# Patient Record
Sex: Female | Born: 1995 | Race: White | Hispanic: No | Marital: Single | Smoking: Never smoker
Health system: Southern US, Community
[De-identification: ages and names within clinical notes are randomized; demographics above are authoritative.]

## PROBLEM LIST (undated history)

## (undated) DIAGNOSIS — N739 Female pelvic inflammatory disease, unspecified: Secondary | ICD-10-CM

## (undated) DIAGNOSIS — E669 Obesity, unspecified: Secondary | ICD-10-CM

---
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
## 2016-11-19 DIAGNOSIS — N739 Female pelvic inflammatory disease, unspecified: Secondary | ICD-10-CM

## 2016-11-19 HISTORY — PX: APPENDECTOMY: SHX54

## 2016-11-19 HISTORY — DX: Female pelvic inflammatory disease, unspecified: N73.9

## 2016-12-04 ENCOUNTER — Emergency Department (HOSPITAL_COMMUNITY): Payer: Self-pay | Admitting: Anesthesiology

## 2016-12-04 ENCOUNTER — Emergency Department (HOSPITAL_COMMUNITY): Payer: Self-pay

## 2016-12-04 ENCOUNTER — Inpatient Hospital Stay (HOSPITAL_COMMUNITY)
Admission: EM | Admit: 2016-12-04 | Discharge: 2016-12-06 | DRG: 750 | Disposition: A | Discharge status: Home / Self Care | Payer: Self-pay | Attending: General Surgery | Admitting: General Surgery

## 2016-12-04 ENCOUNTER — Encounter (HOSPITAL_COMMUNITY): Admission: EM | Disposition: A | Discharge status: Home / Self Care | Payer: Self-pay | Source: Home / Self Care

## 2016-12-04 ENCOUNTER — Encounter (HOSPITAL_COMMUNITY): Payer: Self-pay | Admitting: Emergency Medicine

## 2016-12-04 DIAGNOSIS — Z793 Long term (current) use of hormonal contraceptives: Secondary | ICD-10-CM

## 2016-12-04 DIAGNOSIS — D72828 Other elevated white blood cell count: Secondary | ICD-10-CM

## 2016-12-04 DIAGNOSIS — R198 Other specified symptoms and signs involving the digestive system and abdomen: Secondary | ICD-10-CM

## 2016-12-04 DIAGNOSIS — N730 Acute parametritis and pelvic cellulitis: Secondary | ICD-10-CM | POA: Diagnosis present

## 2016-12-04 DIAGNOSIS — Z68.41 Body mass index (BMI) pediatric, greater than or equal to 95th percentile for age: Secondary | ICD-10-CM

## 2016-12-04 DIAGNOSIS — N739 Female pelvic inflammatory disease, unspecified: Principal | ICD-10-CM | POA: Diagnosis present

## 2016-12-04 DIAGNOSIS — N83209 Unspecified ovarian cyst, unspecified side: Secondary | ICD-10-CM

## 2016-12-04 DIAGNOSIS — N938 Other specified abnormal uterine and vaginal bleeding: Secondary | ICD-10-CM | POA: Diagnosis present

## 2016-12-04 DIAGNOSIS — R109 Unspecified abdominal pain: Secondary | ICD-10-CM

## 2016-12-04 DIAGNOSIS — N83201 Unspecified ovarian cyst, right side: Secondary | ICD-10-CM | POA: Diagnosis present

## 2016-12-04 DIAGNOSIS — K668 Other specified disorders of peritoneum: Secondary | ICD-10-CM | POA: Diagnosis present

## 2016-12-04 HISTORY — DX: Female pelvic inflammatory disease, unspecified: N73.9

## 2016-12-04 HISTORY — PX: LAPAROSCOPIC APPENDECTOMY: SHX408

## 2016-12-04 HISTORY — PX: LAPAROSCOPY: SHX197

## 2016-12-04 LAB — URINALYSIS, ROUTINE W REFLEX MICROSCOPIC
BILIRUBIN URINE: NEGATIVE
GLUCOSE, UA: NEGATIVE mg/dL
Ketones, ur: NEGATIVE mg/dL
NITRITE: NEGATIVE
Protein, ur: 100 mg/dL — AB
SPECIFIC GRAVITY, URINE: 1.017 (ref 1.005–1.030)
pH: 6 (ref 5.0–8.0)

## 2016-12-04 LAB — COMPREHENSIVE METABOLIC PANEL
ALK PHOS: 85 U/L (ref 38–126)
ALT: 18 U/L (ref 14–54)
AST: 24 U/L (ref 15–41)
Albumin: 3.6 g/dL (ref 3.5–5.0)
Anion gap: 7 (ref 5–15)
BILIRUBIN TOTAL: 0.6 mg/dL (ref 0.3–1.2)
BUN: 10 mg/dL (ref 6–20)
CALCIUM: 9.1 mg/dL (ref 8.9–10.3)
CO2: 23 mmol/L (ref 22–32)
Chloride: 105 mmol/L (ref 101–111)
Creatinine, Ser: 0.55 mg/dL (ref 0.44–1.00)
GFR calc Af Amer: >60 mL/min (ref >60)
GLUCOSE: 110 mg/dL — AB (ref 65–99)
Potassium: 3.6 mmol/L (ref 3.5–5.1)
Sodium: 135 mmol/L (ref 135–145)
TOTAL PROTEIN: 6.8 g/dL (ref 6.5–8.1)

## 2016-12-04 LAB — I-STAT BETA HCG BLOOD, ED (MC, WL, AP ONLY): I-stat hCG, quantitative: <5 m[IU]/mL (ref <5)

## 2016-12-04 LAB — CBC
HCT: 31.2 % — ABNORMAL LOW (ref 36.0–46.0)
Hemoglobin: 9.8 g/dL — ABNORMAL LOW (ref 12.0–15.0)
MCH: 22.2 pg — ABNORMAL LOW (ref 26.0–34.0)
MCHC: 31.4 g/dL (ref 30.0–36.0)
MCV: 70.6 fL — AB (ref 78.0–100.0)
PLATELETS: 290 10*3/uL (ref 150–400)
RBC: 4.42 MIL/uL (ref 3.87–5.11)
RDW: 16.8 % — AB (ref 11.5–15.5)
WBC: 12.1 10*3/uL — AB (ref 4.0–10.5)

## 2016-12-04 LAB — TYPE AND SCREEN
ABO/RH(D): O POS
ANTIBODY SCREEN: NEGATIVE

## 2016-12-04 LAB — ABO/RH: ABO/RH(D): O POS

## 2016-12-04 LAB — WET PREP, GENITAL
Sperm: NONE SEEN
Trich, Wet Prep: NONE SEEN
Yeast Wet Prep HPF POC: NONE SEEN

## 2016-12-04 LAB — LIPASE, BLOOD: Lipase: 25 U/L (ref 11–51)

## 2016-12-04 SURGERY — LAPAROSCOPY, DIAGNOSTIC
Anesthesia: General | Site: Abdomen

## 2016-12-04 MED ORDER — PANTOPRAZOLE SODIUM 40 MG IV SOLR: 40.0000 mg | Freq: Every day | INTRAVENOUS | Status: DC

## 2016-12-04 MED ORDER — PROPOFOL 10 MG/ML IV BOLUS
INTRAVENOUS | Status: AC
  Filled 2016-12-04: qty 20

## 2016-12-04 MED ORDER — PHENYLEPHRINE 40 MCG/ML (10ML) SYRINGE FOR IV PUSH (FOR BLOOD PRESSURE SUPPORT)
PREFILLED_SYRINGE | INTRAVENOUS | Status: DC | PRN
  Administered 2016-12-04 (×6): 80 ug via INTRAVENOUS

## 2016-12-04 MED ORDER — LIDOCAINE 2% (20 MG/ML) 5 ML SYRINGE
INTRAMUSCULAR | Status: AC
  Filled 2016-12-04: qty 5

## 2016-12-04 MED ORDER — LACTATED RINGERS IV SOLN: INTRAVENOUS | Status: DC | PRN

## 2016-12-04 MED ORDER — MORPHINE SULFATE (PF) 2 MG/ML IV SOLN: 2.0000 mg | INTRAVENOUS | Status: DC | PRN

## 2016-12-04 MED ORDER — ROCURONIUM BROMIDE 10 MG/ML (PF) SYRINGE
PREFILLED_SYRINGE | INTRAVENOUS | Status: DC | PRN
  Administered 2016-12-04: 10 mg via INTRAVENOUS
  Administered 2016-12-04: 30 mg via INTRAVENOUS

## 2016-12-04 MED ORDER — ONDANSETRON HCL 4 MG/2ML IJ SOLN: 4.0000 mg | Freq: Four times a day (QID) | INTRAMUSCULAR | Status: DC | PRN

## 2016-12-04 MED ORDER — ONDANSETRON 4 MG PO TBDP: 4.0000 mg | ORAL_TABLET | Freq: Four times a day (QID) | ORAL | Status: DC | PRN

## 2016-12-04 MED ORDER — PANTOPRAZOLE SODIUM 40 MG IV SOLR
40.0000 mg | INTRAVENOUS | Status: DC
  Administered 2016-12-04 – 2016-12-05 (×2): 40 mg via INTRAVENOUS
  Filled 2016-12-04 (×2): qty 40

## 2016-12-04 MED ORDER — SODIUM CHLORIDE 0.9 % IV BOLUS (SEPSIS)
1000.0000 mL | Freq: Once | INTRAVENOUS | Status: AC
  Administered 2016-12-04: 1000 mL via INTRAVENOUS

## 2016-12-04 MED ORDER — SODIUM CHLORIDE 0.9 % IR SOLN
Status: DC | PRN
  Administered 2016-12-04 (×2): 1000 mL

## 2016-12-04 MED ORDER — ENOXAPARIN SODIUM 40 MG/0.4ML ~~LOC~~ SOLN
40.0000 mg | SUBCUTANEOUS | Status: DC
  Administered 2016-12-05 – 2016-12-06 (×2): 40 mg via SUBCUTANEOUS
  Filled 2016-12-04 (×2): qty 0.4

## 2016-12-04 MED ORDER — ONDANSETRON HCL 4 MG/2ML IJ SOLN
INTRAMUSCULAR | Status: AC
  Filled 2016-12-04: qty 2

## 2016-12-04 MED ORDER — ONDANSETRON HCL 4 MG/2ML IJ SOLN
INTRAMUSCULAR | Status: DC | PRN
  Administered 2016-12-04: 4 mg via INTRAVENOUS

## 2016-12-04 MED ORDER — PIPERACILLIN-TAZOBACTAM 3.375 G IVPB
3.3750 g | Freq: Three times a day (TID) | INTRAVENOUS | Status: DC
  Filled 2016-12-04 (×2): qty 50

## 2016-12-04 MED ORDER — DEXAMETHASONE SODIUM PHOSPHATE 10 MG/ML IJ SOLN
INTRAMUSCULAR | Status: DC | PRN
  Administered 2016-12-04: 10 mg via INTRAVENOUS

## 2016-12-04 MED ORDER — SODIUM CHLORIDE 0.9 % IV SOLN
INTRAVENOUS | Status: DC
  Administered 2016-12-04 – 2016-12-05 (×4): via INTRAVENOUS

## 2016-12-04 MED ORDER — LACTATED RINGERS IV SOLN
INTRAVENOUS | Status: DC | PRN
  Administered 2016-12-04: 14:00:00 via INTRAVENOUS

## 2016-12-04 MED ORDER — OXYCODONE HCL 5 MG PO TABS
5.0000 mg | ORAL_TABLET | ORAL | Status: DC | PRN
  Administered 2016-12-04: 5 mg via ORAL
  Administered 2016-12-05: 10 mg via ORAL
  Administered 2016-12-05: 5 mg via ORAL
  Filled 2016-12-04: qty 1
  Filled 2016-12-04 (×2): qty 2

## 2016-12-04 MED ORDER — BUPIVACAINE HCL (PF) 0.25 % IJ SOLN
INTRAMUSCULAR | Status: DC | PRN
  Administered 2016-12-04: 25 mL

## 2016-12-04 MED ORDER — MORPHINE SULFATE (PF) 4 MG/ML IV SOLN
4.0000 mg | Freq: Once | INTRAVENOUS | Status: AC
  Administered 2016-12-04: 4 mg via INTRAVENOUS
  Filled 2016-12-04: qty 1

## 2016-12-04 MED ORDER — BUPIVACAINE HCL (PF) 0.25 % IJ SOLN
INTRAMUSCULAR | Status: AC
  Filled 2016-12-04: qty 30

## 2016-12-04 MED ORDER — MIDAZOLAM HCL 2 MG/2ML IJ SOLN
INTRAMUSCULAR | Status: AC
  Filled 2016-12-04: qty 2

## 2016-12-04 MED ORDER — ALBUMIN HUMAN 5 % IV SOLN
INTRAVENOUS | Status: DC | PRN
  Administered 2016-12-04: 14:00:00 via INTRAVENOUS

## 2016-12-04 MED ORDER — DEXTROSE 5 % IV SOLN
100.0000 mg | Freq: Two times a day (BID) | INTRAVENOUS | Status: DC
  Administered 2016-12-04 – 2016-12-06 (×4): 100 mg via INTRAVENOUS
  Filled 2016-12-04 (×5): qty 100

## 2016-12-04 MED ORDER — DEXTROSE 5 % IV SOLN
2.0000 g | Freq: Four times a day (QID) | INTRAVENOUS | Status: DC
  Administered 2016-12-04 – 2016-12-06 (×7): 2 g via INTRAVENOUS
  Filled 2016-12-04 (×11): qty 2

## 2016-12-04 MED ORDER — FENTANYL CITRATE (PF) 100 MCG/2ML IJ SOLN
INTRAMUSCULAR | Status: DC | PRN
  Administered 2016-12-04: 100 ug via INTRAVENOUS
  Administered 2016-12-04 (×3): 50 ug via INTRAVENOUS

## 2016-12-04 MED ORDER — 0.9 % SODIUM CHLORIDE (POUR BTL) OPTIME
TOPICAL | Status: DC | PRN
  Administered 2016-12-04: 1000 mL

## 2016-12-04 MED ORDER — ACETAMINOPHEN 650 MG RE SUPP: 650.0000 mg | Freq: Four times a day (QID) | RECTAL | Status: DC | PRN

## 2016-12-04 MED ORDER — DEXAMETHASONE SODIUM PHOSPHATE 10 MG/ML IJ SOLN
INTRAMUSCULAR | Status: AC
  Filled 2016-12-04: qty 1

## 2016-12-04 MED ORDER — IOPAMIDOL (ISOVUE-300) INJECTION 61%
INTRAVENOUS | Status: AC
  Administered 2016-12-04: 100 mL
  Filled 2016-12-04: qty 100

## 2016-12-04 MED ORDER — DIPHENHYDRAMINE HCL 50 MG/ML IJ SOLN: 25.0000 mg | Freq: Four times a day (QID) | INTRAMUSCULAR | Status: DC | PRN

## 2016-12-04 MED ORDER — ACETAMINOPHEN 325 MG PO TABS
650.0000 mg | ORAL_TABLET | Freq: Four times a day (QID) | ORAL | Status: DC | PRN
  Administered 2016-12-05: 650 mg via ORAL
  Filled 2016-12-04: qty 2

## 2016-12-04 MED ORDER — FENTANYL CITRATE (PF) 250 MCG/5ML IJ SOLN
INTRAMUSCULAR | Status: AC
Start: 2016-12-04 — End: 2016-12-04
  Filled 2016-12-04: qty 5

## 2016-12-04 MED ORDER — PIPERACILLIN-TAZOBACTAM 3.375 G IVPB 30 MIN
3.3750 g | Freq: Once | INTRAVENOUS | Status: AC
  Administered 2016-12-04: 3.375 g via INTRAVENOUS
  Filled 2016-12-04: qty 50

## 2016-12-04 MED ORDER — ROCURONIUM BROMIDE 50 MG/5ML IV SOSY
PREFILLED_SYRINGE | INTRAVENOUS | Status: AC
  Filled 2016-12-04: qty 5

## 2016-12-04 MED ORDER — SUGAMMADEX SODIUM 200 MG/2ML IV SOLN
INTRAVENOUS | Status: DC | PRN
  Administered 2016-12-04: 200 mg via INTRAVENOUS

## 2016-12-04 MED ORDER — MIDAZOLAM HCL 2 MG/2ML IJ SOLN
INTRAMUSCULAR | Status: DC | PRN
  Administered 2016-12-04: 2 mg via INTRAVENOUS

## 2016-12-04 MED ORDER — DIPHENHYDRAMINE HCL 25 MG PO CAPS
25.0000 mg | ORAL_CAPSULE | Freq: Four times a day (QID) | ORAL | Status: DC | PRN
  Administered 2016-12-06: 25 mg via ORAL
  Filled 2016-12-04: qty 1

## 2016-12-04 MED ORDER — ONDANSETRON HCL 4 MG/2ML IJ SOLN
4.0000 mg | Freq: Once | INTRAMUSCULAR | Status: AC
  Administered 2016-12-04: 4 mg via INTRAVENOUS
  Filled 2016-12-04: qty 2

## 2016-12-04 MED ORDER — IOPAMIDOL (ISOVUE-370) INJECTION 76%
INTRAVENOUS | Status: AC
  Administered 2016-12-04: 100 mL
  Filled 2016-12-04: qty 100

## 2016-12-04 MED ORDER — LIDOCAINE 2% (20 MG/ML) 5 ML SYRINGE
INTRAMUSCULAR | Status: DC | PRN
  Administered 2016-12-04: 100 mg via INTRAVENOUS

## 2016-12-04 MED ORDER — PROPOFOL 10 MG/ML IV BOLUS
INTRAVENOUS | Status: DC | PRN
  Administered 2016-12-04: 200 mg via INTRAVENOUS

## 2016-12-04 MED ORDER — SUCCINYLCHOLINE CHLORIDE 200 MG/10ML IV SOSY
PREFILLED_SYRINGE | INTRAVENOUS | Status: DC | PRN
  Administered 2016-12-04: 120 mg via INTRAVENOUS

## 2016-12-04 SURGICAL SUPPLY — 50 items
CANISTER SUCT 3000ML PPV (MISCELLANEOUS) ×5 IMPLANT
CHLORAPREP W/TINT 26ML (MISCELLANEOUS) ×5 IMPLANT
COVER SURGICAL LIGHT HANDLE (MISCELLANEOUS) ×10 IMPLANT
CUTTER FLEX LINEAR 45M (STAPLE) ×5 IMPLANT
DERMABOND ADVANCED (GAUZE/BANDAGES/DRESSINGS) ×2
DERMABOND ADVANCED .7 DNX12 (GAUZE/BANDAGES/DRESSINGS) ×3 IMPLANT
DRAPE LAPAROSCOPIC ABDOMINAL (DRAPES) ×5 IMPLANT
DRAPE WARM FLUID 44X44 (DRAPE) ×5 IMPLANT
DRSG OPSITE POSTOP 4X10 (GAUZE/BANDAGES/DRESSINGS) IMPLANT
DRSG OPSITE POSTOP 4X8 (GAUZE/BANDAGES/DRESSINGS) IMPLANT
ELECT BLADE 6.5 EXT (BLADE) ×5 IMPLANT
ELECT CAUTERY BLADE 6.4 (BLADE) ×5 IMPLANT
ELECT REM PT RETURN 9FT ADLT (ELECTROSURGICAL) ×5
ELECTRODE REM PT RTRN 9FT ADLT (ELECTROSURGICAL) ×3 IMPLANT
GLOVE BIO SURGEON STRL SZ7 (GLOVE) ×5 IMPLANT
GLOVE BIO SURGEON STRL SZ8 (GLOVE) ×5 IMPLANT
GLOVE BIOGEL PI IND STRL 8 (GLOVE) ×3 IMPLANT
GLOVE BIOGEL PI INDICATOR 8 (GLOVE) ×2
GOWN STRL REUS W/ TWL LRG LVL3 (GOWN DISPOSABLE) ×6 IMPLANT
GOWN STRL REUS W/ TWL XL LVL3 (GOWN DISPOSABLE) ×3 IMPLANT
GOWN STRL REUS W/TWL LRG LVL3 (GOWN DISPOSABLE) ×4
GOWN STRL REUS W/TWL XL LVL3 (GOWN DISPOSABLE) ×2
GRASPER SUT TROCAR 14GX15 (MISCELLANEOUS) ×5 IMPLANT
KIT BASIN OR (CUSTOM PROCEDURE TRAY) ×5 IMPLANT
KIT ROOM TURNOVER OR (KITS) ×5 IMPLANT
LIGASURE IMPACT 36 18CM CVD LR (INSTRUMENTS) IMPLANT
NEEDLE 22X1 1/2 (OR ONLY) (NEEDLE) ×5 IMPLANT
NS IRRIG 1000ML POUR BTL (IV SOLUTION) ×10 IMPLANT
PAD ARMBOARD 7.5X6 YLW CONV (MISCELLANEOUS) ×10 IMPLANT
POUCH SPECIMEN RETRIEVAL 10MM (ENDOMECHANICALS) ×5 IMPLANT
RELOAD 45 VASCULAR/THIN (ENDOMECHANICALS) ×5 IMPLANT
SET IRRIG TUBING LAPAROSCOPIC (IRRIGATION / IRRIGATOR) ×5 IMPLANT
SHEARS HARMONIC ACE PLUS 36CM (ENDOMECHANICALS) ×5 IMPLANT
SLEEVE ENDOPATH XCEL 5M (ENDOMECHANICALS) ×10 IMPLANT
SPECIMEN JAR LARGE (MISCELLANEOUS) IMPLANT
SPONGE LAP 18X18 X RAY DECT (DISPOSABLE) IMPLANT
SUCTION POOLE TIP (SUCTIONS) ×5 IMPLANT
SUT SILK 2 0 SH CR/8 (SUTURE) ×5 IMPLANT
SUT SILK 2 0 TIES 10X30 (SUTURE) ×5 IMPLANT
SUT SILK 3 0 SH CR/8 (SUTURE) ×5 IMPLANT
SUT SILK 3 0 TIES 10X30 (SUTURE) ×5 IMPLANT
SUT VIC AB 4-0 PS2 18 (SUTURE) ×5 IMPLANT
SUT VICRYL 0 UR6 27IN ABS (SUTURE) ×5 IMPLANT
TOWEL OR 17X26 10 PK STRL BLUE (TOWEL DISPOSABLE) ×5 IMPLANT
TRAP SPECIMEN MUCOUS 40CC (MISCELLANEOUS) ×5 IMPLANT
TRAY FOLEY W/METER SILVER 16FR (SET/KITS/TRAYS/PACK) ×5 IMPLANT
TROCAR XCEL BLUNT TIP 100MML (ENDOMECHANICALS) ×5 IMPLANT
TROCAR XCEL NON-BLD 5MMX100MML (ENDOMECHANICALS) ×5 IMPLANT
TUBING INSUFFLATION (TUBING) ×5 IMPLANT
YANKAUER SUCT BULB TIP NO VENT (SUCTIONS) ×5 IMPLANT

## 2016-12-04 NOTE — ED Notes (Signed)
Patient transported to Ultrasound 

## 2016-12-04 NOTE — ED Notes (Signed)
General Surgery at the bedside

## 2016-12-04 NOTE — Anesthesia Preprocedure Evaluation (Addendum)
Anesthesia Evaluation  Patient identified by MRN, date of birth, ID band Patient awake    Reviewed: Allergy & Precautions, NPO status , Patient's Chart, lab work & pertinent test results  History of Anesthesia Complications Negative for: history of anesthetic complications  Airway Mallampati: II  TM Distance: >3 FB Neck ROM: Full    Dental no notable dental hx. (+) Dental Advisory Given   Pulmonary neg pulmonary ROS,    Pulmonary exam normal        Cardiovascular negative cardio ROS Normal cardiovascular exam     Neuro/Psych negative neurological ROS  negative psych ROS   GI/Hepatic negative GI ROS, Neg liver ROS,   Endo/Other  Morbid obesity  Renal/GU negative Renal ROS     Musculoskeletal   Abdominal   Peds  Hematology   Anesthesia Other Findings   Reproductive/Obstetrics                            Anesthesia Physical Anesthesia Plan  ASA: II  Anesthesia Plan: General   Post-op Pain Management:    Induction: Intravenous, Rapid sequence and Cricoid pressure planned  Airway Management Planned: Oral ETT  Additional Equipment:   Intra-op Plan:   Post-operative Plan: Extubation in OR  Informed Consent: I have reviewed the patients History and Physical, chart, labs and discussed the procedure including the risks, benefits and alternatives for the proposed anesthesia with the patient or authorized representative who has indicated his/her understanding and acceptance.   Dental advisory given  Plan Discussed with: CRNA, Anesthesiologist and Surgeon  Anesthesia Plan Comments:         Anesthesia Quick Evaluation

## 2016-12-04 NOTE — ED Triage Notes (Signed)
Pt reports lower abd pain that started at 0100 this date. Pt also reports vaginal bleeding for 3 months.

## 2016-12-04 NOTE — Op Note (Addendum)
12/04/2016  3:24 PM  PATIENT:  Brianna West  21 y.o. female  PRE-OPERATIVE DIAGNOSIS:  Free Air in the Abdomen   POST-OPERATIVE DIAGNOSIS: severe pelvic inflammatory disease  PROCEDURE:  Procedure(s): LAPAROSCOPY DIAGNOSTIC DRAINAGE OF PELVIC ABSCESS APPENDECTOMY LAPAROSCOPIC  SURGEON:  Surgeon(s): Violeta Gelinas, MD  ASSISTANTS: none   ANESTHESIA:   local and general  EBL:  Total I/O In: 4050 [I.V.:1750; IV Piggyback:2300] Out: 360 [Urine:300; Other:50; Blood:10]  BLOOD ADMINISTERED:none  DRAINS: none   SPECIMEN:  Excision  DISPOSITION OF SPECIMEN:  PATHOLOGY  COUNTS:  YES  DICTATION: .Dragon Dictation Findings: severe pelvic inflammatory disease   Procedure in detail: [name] presented to the emergency department with pelvic pain and a moderate pneumoperitoneum. She is brought for exploration. She received IV antibiotics. Informed consent was obtained. She was taken to the operating room and general endotracheal anesthesia was administered by the anesthesia staff. Her abdomen was prepped and draped in sterile fashion. We did a time out procedure.The infraumbilical region was infiltrated with local. Infraumbilical incision was made. Subcutaneous tissues were dissected down revealing the anterior fascia. This was divided sharply along the midline. Peritoneal cavity was entered under direct vision without complication. A 0 Vicryl pursestring was placed around the fascial opening. Hassan trocar was inserted into the abdomen. The abdomen was insufflated with carbon dioxide in standard fashion. Under direct vision a 5 mm left lower quadrant and a 5 mm right mid abdomen port were placed. Local was used at each port site. Laparoscopic exploration revealed a large pus around her uterus and fallopian tubes bilaterally with significantly inflamed fallopian tubes. This fluid was sent for culture. The appendix appeared normal. The stomach appeared normal. The duodenum appeared  normal. There is no significant fluid in the upper abdomen. The small bowel was run from the terminal ileum back to the ligament of Treitz and no perforations were found. There is no inflammation. The rectosigmoid colon was normal. The remainder of the colon appeared normal. The appendix was then removed by dividing the mesoappendix with the harmonic scalpel. The base was divided with Endo GIA with a vascular load. Was placed in a bag and removed from the abdomen. Next, I copiously irrigated the pelvis and irrigation fluid returned clear. Next, the [facility] trocar was removed. The pursestring was tied to close the fascia there. Additionally, a 0 Vicryl suture was placed across that opening with a suture passer and this was tied securely. The remainder the ports were removed under direct vision. Pneumoperitoneum was released. All 4 wounds were copiously irrigated and the skin of each was closed with running 4 Vicryl subcuticular followed by Dermabond. All counts were correct. She tolerated procedure without apparent complications and was taken recovery in stable condition.  PATIENT DISPOSITION:  PACU - hemodynamically stable.   Delay start of Pharmacological VTE agent (>24hrs) due to surgical blood loss or risk of bleeding:  yes  Violeta Gelinas, MD, MPH, FACS Pager: [phone]  4/16/20183:24 PM

## 2016-12-04 NOTE — ED Notes (Signed)
Report given to short stay pt brought upstairs appears in no distress

## 2016-12-04 NOTE — H&P (Signed)
[facility]    Admission Note    Diagnosis: pneumoperitoneum    Requesting Physician: Dr. Aliene Beams  HPI: 21 yo Hispanic female that presented to the ER early this morning with sudden onset pelvic pain that woke her from sleep around 1am. Her pain began in her lower bilateral abdomen and is now generalized. It is an intermittently sharp but primarily aching pain, that is worse with movement, cough, sneeze, laugh and palpation. No alleviating factors. No improvement with home OTC meds. No associated nausea, vomiting, or diarrhea. No fever/chills. She does note chronic vaginal bleeding that has been "on" for 3 months, but this is not necessarily atypical for her in setting of having a Nexplanon implant in. No previous abdominal surgeries. No history of tobacco or alcohol use. No NSAID use. No history of GERD or H. Pylori disease. No GU complaints. Last BM was normal and occurred 12/03/2016. G1P1 (vaginal delivery 2016). Works as Child psychotherapist.   ER course: Presented with tachycardia but was normotensive. IV NS bolus given in ED and at time of my exam HR was around 90 bpm on monitor. CT A/P (IV contrast only) with evidence of 3.6cm right ovarian cyst and moderate epigastric pneumoperitoneum.   Objective: Vital signs in last 24 hours: Temp:  [99.3 F (37.4 C)] 99.3 F (37.4 C) (04/16 0651) Pulse Rate:  [111-125] 111 (04/16 0859) Resp:  [18-22] 18 (04/16 0859) BP: (127-143)/(71-81) 127/71 (04/16 0859) SpO2:  [98 %-100 %] 98 % (04/16 0859)    Intake/Output from previous day: No intake/output data recorded. Intake/Output this shift: Total I/O In: 1050 [IV Piggyback:1050] Out: -   PE: General: pleasant, WD, WN female who is seated in bed in NAD. Watching TV. HEENT: head is normocephalic, atraumatic.  Sclera are not injected and not icteric. Mouth is pink and moist CV/Heart: tachycardia with regular rhythm. Normal s1,s2. No obvious murmurs. Palpable radial and pedal pulses bilaterally.  Brisk capillary refill.  Lungs: CTAB, no wheezes, rhonchi, or rales noted.  Respiratory effort not labored. Abd: soft, obese, no distension, normal bowel sounds, no masses, hernias, or organomegaly. No clinical signs of peritonitis. No CVAT. Mild tenderness to deep palpation at LLQ, periumbilical region, RUQ and LUQ.  MS: all 4 extremities are symmetrical with no cyanosis, clubbing, or edema. Skin: warm and dry with no masses, lesions, or rashes Psych: A&Ox3 with an appropriate affect. GYN: see ED pelvic exam  Lab Results:   Recent Labs  12/04/16 0702  WBC 12.1*  HGB 9.8*  HCT 31.2*  PLT 290   BMET  Recent Labs  12/04/16 0702  NA 135  K 3.6  CL 105  CO2 23  GLUCOSE 110*  BUN 10  CREATININE 0.55  CALCIUM 9.1   PT/INR No results for input(s): LABPROT, INR in the last 72 hours. CMP     Component Value Date/Time   NA 135 12/04/2016 0702   K 3.6 12/04/2016 0702   CL 105 12/04/2016 0702   CO2 23 12/04/2016 0702   GLUCOSE 110 (H) 12/04/2016 0702   BUN 10 12/04/2016 0702   CREATININE 0.55 12/04/2016 0702   CALCIUM 9.1 12/04/2016 0702   PROT 6.8 12/04/2016 0702   ALBUMIN 3.6 12/04/2016 0702   AST 24 12/04/2016 0702   ALT 18 12/04/2016 0702   ALKPHOS 85 12/04/2016 0702   BILITOT 0.6 12/04/2016 0702   GFRNONAA >60 12/04/2016 0702   GFRAA >60 12/04/2016 0702   Lipase     Component Value Date/Time   LIPASE  25 12/04/2016 1610       Studies/Results: Ct Abdomen Pelvis W Contrast  Result Date: 12/04/2016 CLINICAL DATA:  Acute right lower quadrant abdominal pain. EXAM: CT ABDOMEN AND PELVIS WITH CONTRAST TECHNIQUE: Multidetector CT imaging of the abdomen and pelvis was performed using the standard protocol following bolus administration of intravenous contrast. CONTRAST:  100 mL of Isovue-300 intravenously. COMPARISON:  None. FINDINGS: Lower chest: No acute abnormality. Hepatobiliary: Possible gallstone may be present. Liver appears normal. Pancreas: Unremarkable. No  pancreatic ductal dilatation or surrounding inflammatory changes. Spleen: Normal in size without focal abnormality. Adrenals/Urinary Tract: Adrenal glands are unremarkable. Kidneys are normal, without renal calculi, focal lesion, or hydronephrosis. Bladder is unremarkable. Stomach/Bowel: Stomach is within normal limits. Appendix appears normal. No evidence of bowel wall thickening, distention, or inflammatory changes. Vascular/Lymphatic: No significant vascular findings are present. No enlarged abdominal or pelvic lymph nodes. Reproductive: 3.6 cm right ovarian cyst is noted. Other: There is noted moderate pneumoperitoneum seen in the epigastric region. No definite hernia is noted. Musculoskeletal: No acute or significant osseous findings. IMPRESSION: 3.6 cm right ovarian cyst is noted. Pelvic ultrasound is recommended for further evaluation. Moderate pneumoperitoneum is noted in the epigastric region concerning for rupture of hollow viscus. Immediate surgical consultation is recommended. Critical Value/emergent results were called by telephone at the time of interpretation on 12/04/2016 at 8:32 am to Dr. Shaune Pollack , who verbally acknowledged these results. Electronically Signed   By: Lupita Raider, M.D.   On: 12/04/2016 08:35    Anti-infectives: Anti-infectives    Start     Dose/Rate Route Frequency Ordered Stop   12/04/16 1500  piperacillin-tazobactam (ZOSYN) IVPB 3.375 g     3.375 g 12.5 mL/hr over 240 Minutes Intravenous Every 8 hours 12/04/16 0837     12/04/16 0845  piperacillin-tazobactam (ZOSYN) IVPB 3.375 g     3.375 g 100 mL/hr over 30 Minutes Intravenous  Once 12/04/16 0835 12/04/16 0928       Assessment/Plan  21 y/o female with abdominal pain and pneumoperitoneum (epigastric). Possible perforated viscus. Patient without risk factors for gastric or duodenal ulceration despite CT findings. Patient currently hemodynamically stable and without clinical signs of peritonitis. Mild anemia  in setting of chronic dysfunctional uterine bleeding rather that acute blood loss. Will obtain pelvic U/S. NPO/IVF. On Zosyn. Case discussed with Dr. Violeta Gelinas who will evaluate to determine need for surgical intervention. Thank you for consultation.    LOS: 0 days    LEE Joeleen Wortley, [facility] 12/04/2016, 9:47 AM

## 2016-12-04 NOTE — ED Provider Notes (Signed)
[facility]-EMERGENCY DEPT Provider Note   CSN: [id number] Arrival date & time: 12/04/16  4098     History   Chief Complaint Chief Complaint  Patient presents with  . Abdominal Pain  . Vaginal Bleeding    HPI Brianna West is a 21 y.o. female.  HPI   21 yo F with PMHx below here with abdominal pain. Pt states that around 1 AM, she developed gradual onset of progressively worsening abdominal pain. Her pain began in her lower bilateral abdomen and is now generalized. It is an intermittently sharp but primarily aching pain, that is worse with movement and palpation. No alleviating factors. No improvement with home OTC meds. No associated nausea, vomiting, or diarrhea. She does note chronic vaginal bleeding that has been "on" for 3 months, but this is not necessarily atypical for her in setting of having a Nexplanon implant in. She is currently trying to get in with an OB for removal of this. Denies urinary frequency, dysuria, or hematuria. No sick contacts.  Past Medical History:  Diagnosis Date  . Obesity     Patient Active Problem List   Diagnosis Date Noted  . Vaginal discharge, bloody 05/18/2015  . Active labor at term 04/05/2015  . [redacted] weeks gestation of pregnancy   . Obesity affecting pregnancy in second trimester, antepartum   . [redacted] weeks gestation of pregnancy   . Encounter for fetal anatomic survey   . Significant discrepancy between uterine size and clinical dates, antepartum   . Obesity complicating pregnancy in second trimester     Past Surgical History:  Procedure Laterality Date  . NO PAST SURGERIES      OB History    Gravida Para Term Preterm AB Living   SAB TAB Ectopic Multiple Live Births         0 1       Home Medications    Prior to Admission medications   Medication Sig Start Date End Date Taking? Authorizing Provider  acetaminophen (TYLENOL) 325 MG tablet Take 2 tablets (650 mg total) by mouth every 4 (four) hours as needed  (for pain scale < 4). 04/08/15  Yes Kathrynn Running, MD  Etonogestrel [facility]) Inject 1 application into the skin once.   Yes Historical Provider, MD  ibuprofen (ADVIL,MOTRIN) 600 MG tablet Take 1 tablet (600 mg total) by mouth every 6 (six) hours. Patient taking differently: Take 600 mg by mouth every 6 (six) hours as needed for moderate pain.  04/08/15  Yes Kathrynn Running, MD    Family History Family History  Problem Relation Age of Onset  . Asthma Neg Hx   . Diabetes Neg Hx   . Heart disease Neg Hx   . Stroke Neg Hx   . Hypertension Neg Hx   . Hearing loss Neg Hx     Social History Social History  Substance Use Topics  . Smoking status: Never Smoker  . Smokeless tobacco: Never Used  . Alcohol use No     Allergies   Patient has no known allergies.   Review of Systems Review of Systems  Constitutional: Positive for fatigue. Negative for chills and fever.  HENT: Negative for congestion, rhinorrhea and sore throat.   Eyes: Negative for visual disturbance.  Respiratory: Negative for cough, shortness of breath and wheezing.   Cardiovascular: Negative for chest pain and leg swelling.  Gastrointestinal: Positive for abdominal pain. Negative for diarrhea, nausea and vomiting.  Genitourinary: Positive for vaginal bleeding. Negative for dysuria, flank pain, vaginal discharge and vaginal pain.  Musculoskeletal: Negative for neck pain.  Skin: Negative for rash.  Allergic/Immunologic: Negative for immunocompromised state.  Neurological: Negative for syncope and headaches.  Hematological: Does not bruise/bleed easily.  All other systems reviewed and are negative.    Physical Exam Updated Vital Signs BP 127/71 (BP Location: Right Arm)   Pulse (!) 111   Temp 99.3 F (37.4 C) (Oral)   Resp 18   Ht  (1.626 m)   LMP 09/05/2016 (Approximate)   SpO2 98%   Physical Exam  Constitutional: She is oriented to person, place, and time. She appears well-developed and  well-nourished. No distress.  HENT:  Head: Normocephalic and atraumatic.  Eyes: Conjunctivae are normal.  Neck: Neck supple.  Cardiovascular: Normal rate, regular rhythm and normal heart sounds.  Exam reveals no friction rub.   No murmur heard. Pulmonary/Chest: Effort normal and breath sounds normal. No respiratory distress. She has no wheezes. She has no rales.  Abdominal: Normal appearance and bowel sounds are normal. She exhibits no distension. There is no hepatosplenomegaly. There is generalized tenderness and tenderness in the right lower quadrant, periumbilical area, suprapubic area and left upper quadrant. There is no rigidity, no rebound, no guarding and no CVA tenderness.  Genitourinary:  Genitourinary Comments: Moderate volume dark red blood in vaginal vault. Os closed. No adnexal TTP or fullness. No CMT.  Musculoskeletal: She exhibits no edema.  Neurological: She is alert and oriented to person, place, and time. She exhibits normal muscle tone.  Skin: Skin is warm. Capillary refill takes less than 2 seconds.  Psychiatric: She has a normal mood and affect.  Nursing note and vitals reviewed.    ED Treatments / Results  Labs (all labs ordered are listed, but only abnormal results are displayed) Labs Reviewed  URINALYSIS, ROUTINE W REFLEX MICROSCOPIC - Abnormal; Notable for the following:       Result Value   Color, Urine ORANGE (*)    APPearance CLOUDY (*)    Hgb urine dipstick LARGE (*)    Protein, ur 100 (*)    Leukocytes, UA TRACE (*)    Bacteria, UA FEW (*)    Squamous Epithelial / LPF 0-5 (*)    All other components within normal limits  CBC - Abnormal; Notable for the following:    WBC 12.1 (*)    Hemoglobin 9.8 (*)    HCT 31.2 (*)    MCV 70.6 (*)    MCH 22.2 (*)    RDW 16.8 (*)    All other components within normal limits  COMPREHENSIVE METABOLIC PANEL - Abnormal; Notable for the following:    Glucose, Bld 110 (*)    All other components within normal limits    WET PREP, GENITAL  CULTURE, BLOOD (ROUTINE X 2)  CULTURE, BLOOD (ROUTINE X 2)  LIPASE, BLOOD  I-STAT BETA HCG BLOOD, ED ([facility], [facility], [facility] ONLY)  TYPE AND SCREEN  GC/CHLAMYDIA PROBE AMP () NOT AT [facility]    EKG  EKG Interpretation None       Radiology Ct Abdomen Pelvis W Contrast  Result Date: 12/04/2016 CLINICAL DATA:  Acute right lower quadrant abdominal pain. EXAM: CT ABDOMEN AND PELVIS WITH CONTRAST TECHNIQUE: Multidetector CT imaging of the abdomen and pelvis was performed using the standard protocol following bolus administration of intravenous contrast. CONTRAST:  100 mL of Isovue-300 intravenously. COMPARISON:  None. FINDINGS: Lower chest: No acute abnormality. Hepatobiliary: Possible  gallstone may be present. Liver appears normal. Pancreas: Unremarkable. No pancreatic ductal dilatation or surrounding inflammatory changes. Spleen: Normal in size without focal abnormality. Adrenals/Urinary Tract: Adrenal glands are unremarkable. Kidneys are normal, without renal calculi, focal lesion, or hydronephrosis. Bladder is unremarkable. Stomach/Bowel: Stomach is within normal limits. Appendix appears normal. No evidence of bowel wall thickening, distention, or inflammatory changes. Vascular/Lymphatic: No significant vascular findings are present. No enlarged abdominal or pelvic lymph nodes. Reproductive: 3.6 cm right ovarian cyst is noted. Other: There is noted moderate pneumoperitoneum seen in the epigastric region. No definite hernia is noted. Musculoskeletal: No acute or significant osseous findings. IMPRESSION: 3.6 cm right ovarian cyst is noted. Pelvic ultrasound is recommended for further evaluation. Moderate pneumoperitoneum is noted in the epigastric region concerning for rupture of hollow viscus. Immediate surgical consultation is recommended. Critical Value/emergent results were called by telephone at the time of interpretation on 12/04/2016 at 8:32 am to Dr. Shaune Pollack , who  verbally acknowledged these results. Electronically Signed   By: Lupita Raider, M.D.   On: 12/04/2016 08:35    Procedures .Critical Care Performed by: Shaune Pollack Authorized by: Shaune Pollack      (including critical care time)  CRITICAL CARE Performed by: Dollene Cleveland Total critical care time: 35 minutes  Critical care time was exclusive of separately billable procedures and treating other patients.  Critical care was necessary to treat or prevent imminent or life-threatening deterioration.  Critical care was time spent personally by me on the following activities: development of treatment plan with patient and/or surrogate as well as nursing, discussions with consultants, evaluation of patient's response to treatment, examination of patient, obtaining history from patient or surrogate, ordering and performing treatments and interventions, ordering and review of laboratory studies, ordering and review of radiographic studies, pulse oximetry and re-evaluation of patient's condition.    Medications Ordered in ED Medications  piperacillin-tazobactam (ZOSYN) IVPB 3.375 g (not administered)  sodium chloride 0.9 % bolus 1,000 mL (0 mLs Intravenous Stopped 12/04/16 0859)  morphine 4 MG/ML injection 4 mg (4 mg Intravenous Given 12/04/16 0723)  ondansetron (ZOFRAN) injection 4 mg (4 mg Intravenous Given 12/04/16 0724)  iopamidol (ISOVUE-370) 76 % injection (100 mLs  Contrast Given 12/04/16 0753)  iopamidol (ISOVUE-300) 61 % injection (100 mLs  Contrast Given 12/04/16 0801)  sodium chloride 0.9 % bolus 1,000 mL (1,000 mLs Intravenous New Bag/Given 12/04/16 0856)  piperacillin-tazobactam (ZOSYN) IVPB 3.375 g (0 g Intravenous Stopped 12/04/16 0928)     Initial Impression / Assessment and Plan / ED Course  I have reviewed the triage vital signs and the nursing notes.  Pertinent labs & imaging results that were available during my care of the patient were reviewed by me and considered  in my medical decision making (see chart for details).    21 yo F here with gradual onset diffuse abd pain. No fever, chills. Abdominal exam w/o focal TTP and pelvic exam is largely unremarkable. Given undifferentiated nature of pain, will obtain CT to eval appendicitis, enteritis, ileitis. Less likely cholecystitis based on normal LFTs, absence of significant RUQ TTP or Murphy's. She does have vaginal bleeding but this is chronic and likely 2/2 obesity, birth control and anovulatory bleeding. No focal adnexal TTP, history and exam not c/w torsion or PID.  CT scan shows perforated viscus, suspicious for possible perforated gastric ulcer given location. She denies h/o ulcers but scan is concerning, esp in setting of tachycardia, leukocytosis. Doubt pelvic pathology and although pt does have cyst,  history is not c/w torsion, no signs of perforated uterus. Will consult [facility] for admission. Zosyn ordered, IVF given. NPO.  Final Clinical Impressions(s) / ED Diagnoses   Final diagnoses:  Perforated viscus  Other elevated white blood cell (WBC) count    New Prescriptions New Prescriptions   No medications on file     Shaune Pollack, MD 12/04/16 1000

## 2016-12-04 NOTE — Anesthesia Procedure Notes (Signed)
Procedure Name: Intubation Date/Time: 12/04/2016 1:29 PM Performed by: Geraldo Docker Pre-anesthesia Checklist: Patient identified, Patient being monitored, Timeout performed, Emergency Drugs available and Suction available Patient Re-evaluated:Patient Re-evaluated prior to inductionOxygen Delivery Method: Circle System Utilized Preoxygenation: Pre-oxygenation with 100% oxygen Intubation Type: IV induction, Rapid sequence and Cricoid Pressure applied Laryngoscope Size: Miller and 3 Grade View: Grade I Tube type: Oral Tube size: 7.5 mm Number of attempts: 1 Airway Equipment and Method: Stylet Placement Confirmation: ETT inserted through vocal cords under direct vision,  positive ETCO2 and breath sounds checked- equal and bilateral Secured at: 21 cm Tube secured with: Tape Dental Injury: Teeth and Oropharynx as per pre-operative assessment

## 2016-12-04 NOTE — ED Notes (Signed)
Pt ambulated to restroom, no issues. 

## 2016-12-04 NOTE — ED Notes (Signed)
Pt brought back from US

## 2016-12-04 NOTE — Anesthesia Postprocedure Evaluation (Addendum)
Anesthesia Post Note  Patient: Brianna West  Procedure(s) Performed: Procedure(s) (LRB): LAPAROSCOPY DIAGNOSTIC (N/A) APPENDECTOMY LAPAROSCOPIC  Patient location during evaluation: PACU Anesthesia Type: General Level of consciousness: awake and alert Pain management: pain level controlled Vital Signs Assessment: post-procedure vital signs reviewed and stable Respiratory status: spontaneous breathing, nonlabored ventilation, respiratory function stable and patient connected to nasal cannula oxygen Cardiovascular status: blood pressure returned to baseline and stable Postop Assessment: no signs of nausea or vomiting Anesthetic complications: no       Last Vitals:  Vitals:   12/04/16 1626 12/04/16 1630  BP: 116/73   Pulse: (!) 105 (!) 103  Resp: 16 16  Temp:      Last Pain:  Vitals:   12/04/16 1600  TempSrc:   PainSc: 0-No pain                 Jerrard Bradburn S

## 2016-12-04 NOTE — ED Notes (Signed)
Pt went to CT

## 2016-12-04 NOTE — Transfer of Care (Signed)
Immediate Anesthesia Transfer of Care Note  Patient: Brianna West  Procedure(s) Performed: Procedure(s): LAPAROSCOPY DIAGNOSTIC (N/A) APPENDECTOMY LAPAROSCOPIC  Patient Location: PACU  Anesthesia Type:General  Level of Consciousness: sedated, patient cooperative and responds to stimulation  Airway & Oxygen Therapy: Patient Spontanous Breathing and Patient connected to nasal cannula oxygen  Post-op Assessment: Report given to RN, Post -op Vital signs reviewed and stable and Patient moving all extremities  Post vital signs: Reviewed and stable  Last Vitals:  Vitals:   12/04/16 1225 12/04/16 1526  BP: 134/68 106/64  Pulse: 93 (!) 116  Resp: 18   Temp:  36.5 C    Last Pain:  Vitals:   12/04/16 0759  TempSrc:   PainSc: 0-No pain         Complications: No apparent anesthesia complications

## 2016-12-04 NOTE — Progress Notes (Signed)
Pharmacy Antibiotic Note  Brianna West is a 21 y.o. female admitted on 12/04/2016 with intra-abdominal infxn.  Pharmacy has been consulted for zosyn dosing. Tmax is 99.3 and WBC is elevated at 12.1.   Plan: Zosyn 3.375g IV Q8H (4 hr inf) F/u renal fxn, C&S and clinical status *Pharmacy will sign off as no dose adjustments are anticipated. Thank you for the consult!  Height:  (162.6 cm) IBW/kg (Calculated) : 54.7  Temp (24hrs), Avg:99.3 F (37.4 C), Min:99.3 F (37.4 C), Max:99.3 F (37.4 C)   Recent Labs Lab 12/04/16 0702  WBC 12.1*  CREATININE 0.55    CrCl cannot be calculated (Unknown ideal weight.).    No Known Allergies  Antimicrobials this admission: Zosyn 4/16>>  Dose adjustments this admission: N/A  Microbiology results: Pending  Thank you for allowing pharmacy to be a part of this patient's care.  Gunnard Dorrance, Drake Leach 12/04/2016 8:37 AM

## 2016-12-04 NOTE — ED Notes (Signed)
Patient transported to CT 

## 2016-12-05 ENCOUNTER — Encounter (HOSPITAL_COMMUNITY): Payer: Self-pay | Admitting: General Surgery

## 2016-12-05 LAB — BASIC METABOLIC PANEL
ANION GAP: 11 (ref 5–15)
BUN: <5 mg/dL — ABNORMAL LOW (ref 6–20)
CALCIUM: 8.8 mg/dL — AB (ref 8.9–10.3)
CO2: 20 mmol/L — ABNORMAL LOW (ref 22–32)
Chloride: 105 mmol/L (ref 101–111)
Creatinine, Ser: 0.6 mg/dL (ref 0.44–1.00)
Glucose, Bld: 111 mg/dL — ABNORMAL HIGH (ref 65–99)
Potassium: 3.5 mmol/L (ref 3.5–5.1)
Sodium: 136 mmol/L (ref 135–145)

## 2016-12-05 LAB — CBC
HEMATOCRIT: 30.4 % — AB (ref 36.0–46.0)
Hemoglobin: 9.5 g/dL — ABNORMAL LOW (ref 12.0–15.0)
MCH: 22.2 pg — ABNORMAL LOW (ref 26.0–34.0)
MCHC: 31.3 g/dL (ref 30.0–36.0)
MCV: 71 fL — ABNORMAL LOW (ref 78.0–100.0)
PLATELETS: 333 10*3/uL (ref 150–400)
RBC: 4.28 MIL/uL (ref 3.87–5.11)
RDW: 17.4 % — AB (ref 11.5–15.5)
WBC: 12.8 10*3/uL — ABNORMAL HIGH (ref 4.0–10.5)

## 2016-12-05 LAB — GC/CHLAMYDIA PROBE AMP (~~LOC~~) NOT AT ARMC
CHLAMYDIA, DNA PROBE: NEGATIVE
NEISSERIA GONORRHEA: NEGATIVE

## 2016-12-05 NOTE — Care Management Note (Signed)
Case Management Note  Patient Details  Name: Brianna West MRN: [id number] Date of Birth: 1995-12-04  Subjective/Objective:                    Action/Plan:  4-16 lap appy and drainage of pelvic abscess 4-17 MD plan : Continue IV abx through today then transition to PO tomorrow and likely discharge tomorrow  Can provide MATCH letter for medication assistance on day of discharge. Expected Discharge Date:                  Expected Discharge Plan:  Home/Self Care  In-House Referral:     Discharge planning Services  MATCH Program, Medication Assistance  Post Acute Care Choice:    Choice offered to:     DME Arranged:    DME Agency:     HH Arranged:    HH Agency:     Status of Service:  In process, will continue to follow  If discussed at Long Length of Stay Meetings, dates discussed:    Additional Comments:  Kingsley Plan, RN 12/05/2016, 10:41 AM

## 2016-12-05 NOTE — Progress Notes (Signed)
[facility] Progress Note  1 Day Post-Op  Subjective:  CC: abdominal pain  Pt states mild abdominal pain. She had chills and some diaphoresis overnight but no fever. She is tolerating her diet and having flatus. Asking when she can go home.   Objective: Vital signs in last 24 hours: Temp:  [97.7 F (36.5 C)-98.5 F (36.9 C)] 98.2 F (36.8 C) (04/17 1610) Pulse Rate:  [93-116] 99 (04/17 0632) Resp:  [15-20] 18 (04/17 9604) BP: (105-134)/(60-77) 124/66 (04/17 5409) SpO2:  [94 %-100 %] 97 % (04/17 8119) Weight:  [248 lb 3.2 oz (112.6 kg)] 248 lb 3.2 oz (112.6 kg) (04/16 1745) Last BM Date: 12/04/16  Intake/Output from previous day: 04/16 0701 - 04/17 0700 In: 6356.7 [P.O.:240; I.V.:3216.7; IV Piggyback:2900] Out: 1410 [Urine:1350; Blood:10] Intake/Output this shift: Total I/O In: -  Out: 600 [Urine:600]  PE: Gen:  Alert, NAD, pleasant, cooperative, well appearing Card:  RRR, no M/G/R heard Pulm:  CTA, no W/R/R, effort normal Abd: Soft, not distended, +BS, incisions C/D/I, mild generalized TTP Skin: no rashes noted, warm and dry  Lab Results:   Recent Labs  12/04/16 0702 12/05/16 0423  WBC 12.1* 12.8*  HGB 9.8* 9.5*  HCT 31.2* 30.4*  PLT 290 333   BMET  Recent Labs  12/04/16 0702 12/05/16 0423  NA 135 136  K 3.6 3.5  CL 105 105  CO2 23 20*  GLUCOSE 110* 111*  BUN 10 <5*  CREATININE 0.55 0.60  CALCIUM 9.1 8.8*   PT/INR No results for input(s): LABPROT, INR in the last 72 hours. CMP     Component Value Date/Time   NA 136 12/05/2016 0423   K 3.5 12/05/2016 0423   CL 105 12/05/2016 0423   CO2 20 (L) 12/05/2016 0423   GLUCOSE 111 (H) 12/05/2016 0423   BUN <5 (L) 12/05/2016 0423   CREATININE 0.60 12/05/2016 0423   CALCIUM 8.8 (L) 12/05/2016 0423   PROT 6.8 12/04/2016 0702   ALBUMIN 3.6 12/04/2016 0702   AST 24 12/04/2016 0702   ALT 18 12/04/2016 0702   ALKPHOS 85 12/04/2016 0702   BILITOT 0.6 12/04/2016 0702   GFRNONAA >60  12/05/2016 0423   GFRAA >60 12/05/2016 0423   Lipase     Component Value Date/Time   LIPASE 25 12/04/2016 0702       Studies/Results: US Transvaginal Non-ob  Result Date: 12/04/2016 CLINICAL DATA:  Menorrhagia.  Ovarian cyst. EXAM: TRANSABDOMINAL AND TRANSVAGINAL ULTRASOUND OF PELVIS TECHNIQUE: Both transabdominal and transvaginal ultrasound examinations of the pelvis were performed. Transabdominal technique was performed for global imaging of the pelvis including uterus, ovaries, adnexal regions, and pelvic cul-de-sac. It was necessary to proceed with endovaginal exam following the transabdominal exam to visualize the endometrium and ovaries. COMPARISON:  CT scan 12/04/2016 FINDINGS: Uterus Measurements: 8.2 x 3.4 x 3.7 cm. No fibroids or other mass visualized. Endometrium Thickness: 10.1 mm. Small amount of fluid in the endometrial canal in the lower uterine segment. Right ovary Measurements: 4.2 x 3.8 x 3.2 cm. 3.3 x 2.9 x 3.2 cm cyst Left ovary Measurements: 2.9 x 1.9 x 1.8 cm. Normal appearance/no adnexal mass. Other findings Small amount of free pelvic fluid IMPRESSION: 1. Normal uterus 2. 3.3 cm right ovarian cyst. 3. Small amount of free pelvic fluid. Electronically Signed   By: Rudie Meyer M.D.   On: 12/04/2016 11:20   US Pelvis Complete  Result Date: 12/04/2016 CLINICAL DATA:  Menorrhagia.  Ovarian cyst. EXAM: TRANSABDOMINAL AND TRANSVAGINAL ULTRASOUND OF  PELVIS TECHNIQUE: Both transabdominal and transvaginal ultrasound examinations of the pelvis were performed. Transabdominal technique was performed for global imaging of the pelvis including uterus, ovaries, adnexal regions, and pelvic cul-de-sac. It was necessary to proceed with endovaginal exam following the transabdominal exam to visualize the endometrium and ovaries. COMPARISON:  CT scan 12/04/2016 FINDINGS: Uterus Measurements: 8.2 x 3.4 x 3.7 cm. No fibroids or other mass visualized. Endometrium Thickness: 10.1 mm. Small amount  of fluid in the endometrial canal in the lower uterine segment. Right ovary Measurements: 4.2 x 3.8 x 3.2 cm. 3.3 x 2.9 x 3.2 cm cyst Left ovary Measurements: 2.9 x 1.9 x 1.8 cm. Normal appearance/no adnexal mass. Other findings Small amount of free pelvic fluid IMPRESSION: 1. Normal uterus 2. 3.3 cm right ovarian cyst. 3. Small amount of free pelvic fluid. Electronically Signed   By: Rudie Meyer M.D.   On: 12/04/2016 11:20   Ct Abdomen Pelvis W Contrast  Result Date: 12/04/2016 CLINICAL DATA:  Acute right lower quadrant abdominal pain. EXAM: CT ABDOMEN AND PELVIS WITH CONTRAST TECHNIQUE: Multidetector CT imaging of the abdomen and pelvis was performed using the standard protocol following bolus administration of intravenous contrast. CONTRAST:  100 mL of Isovue-300 intravenously. COMPARISON:  None. FINDINGS: Lower chest: No acute abnormality. Hepatobiliary: Possible gallstone may be present. Liver appears normal. Pancreas: Unremarkable. No pancreatic ductal dilatation or surrounding inflammatory changes. Spleen: Normal in size without focal abnormality. Adrenals/Urinary Tract: Adrenal glands are unremarkable. Kidneys are normal, without renal calculi, focal lesion, or hydronephrosis. Bladder is unremarkable. Stomach/Bowel: Stomach is within normal limits. Appendix appears normal. No evidence of bowel wall thickening, distention, or inflammatory changes. Vascular/Lymphatic: No significant vascular findings are present. No enlarged abdominal or pelvic lymph nodes. Reproductive: 3.6 cm right ovarian cyst is noted. Other: There is noted moderate pneumoperitoneum seen in the epigastric region. No definite hernia is noted. Musculoskeletal: No acute or significant osseous findings. IMPRESSION: 3.6 cm right ovarian cyst is noted. Pelvic ultrasound is recommended for further evaluation. Moderate pneumoperitoneum is noted in the epigastric region concerning for rupture of hollow viscus. Immediate surgical consultation  is recommended. Critical Value/emergent results were called by telephone at the time of interpretation on 12/04/2016 at 8:32 am to Dr. Shaune Pollack , who verbally acknowledged these results. Electronically Signed   By: Lupita Raider, M.D.   On: 12/04/2016 08:35    Anti-infectives: Anti-infectives    Start     Dose/Rate Route Frequency Ordered Stop   12/04/16 1830  cefOXitin (MEFOXIN) 2 g in dextrose 5 % 50 mL IVPB     2 g 100 mL/hr over 30 Minutes Intravenous Every 6 hours 12/04/16 1745     12/04/16 1800  doxycycline (VIBRAMYCIN) 100 mg in dextrose 5 % 250 mL IVPB     100 mg 125 mL/hr over 120 Minutes Intravenous Every 12 hours 12/04/16 1745     12/04/16 1500  piperacillin-tazobactam (ZOSYN) IVPB 3.375 g  Status:  Discontinued     3.375 g 12.5 mL/hr over 240 Minutes Intravenous Every 8 hours 12/04/16 0837 12/04/16 1811   12/04/16 0845  piperacillin-tazobactam (ZOSYN) IVPB 3.375 g     3.375 g 100 mL/hr over 30 Minutes Intravenous  Once 12/04/16 0835 12/04/16 1610       Assessment/Plan  Abdominal pain 2/2 severe PID S/P LAPAROSCOPY DIAGNOSTIC WITH DRAINAGE OF PELVIC ABSCESS and APPENDECTOMY, 12/04/16, Dr. Janee Morn - chlamydia and gonorrhea pending  FEN: heart healthy VTE: lovenox ID: IV Cefoxitin and Doxycycline 4/16>>  Dispo: Continue  IV abx through today then transition to PO tomorrow and likely discharge tomorrow   LOS: 0 days    Jerre Simon , [facility] Surgery 12/05/2016, 9:33 AM Pager: [phone] Consults: [phone] Mon-Fri 7:00 am-4:30 pm Sat-Sun 7:00 am-11:30 am

## 2016-12-06 LAB — CBC
HEMATOCRIT: 27.5 % — AB (ref 36.0–46.0)
Hemoglobin: 8.3 g/dL — ABNORMAL LOW (ref 12.0–15.0)
MCH: 21.4 pg — AB (ref 26.0–34.0)
MCHC: 30.2 g/dL (ref 30.0–36.0)
MCV: 70.9 fL — AB (ref 78.0–100.0)
Platelets: 289 10*3/uL (ref 150–400)
RBC: 3.88 MIL/uL (ref 3.87–5.11)
RDW: 17 % — ABNORMAL HIGH (ref 11.5–15.5)
WBC: 7.6 10*3/uL (ref 4.0–10.5)

## 2016-12-06 LAB — BASIC METABOLIC PANEL
Anion gap: 5 (ref 5–15)
BUN: 7 mg/dL (ref 6–20)
CHLORIDE: 110 mmol/L (ref 101–111)
CO2: 22 mmol/L (ref 22–32)
Calcium: 8.5 mg/dL — ABNORMAL LOW (ref 8.9–10.3)
Creatinine, Ser: 0.65 mg/dL (ref 0.44–1.00)
GFR calc Af Amer: >60 mL/min (ref >60)
GFR calc non Af Amer: >60 mL/min (ref >60)
Glucose, Bld: 96 mg/dL (ref 65–99)
POTASSIUM: 3.7 mmol/L (ref 3.5–5.1)
SODIUM: 137 mmol/L (ref 135–145)

## 2016-12-06 MED ORDER — DOCUSATE SODIUM 100 MG PO CAPS
100.0000 mg | ORAL_CAPSULE | Freq: Every day | ORAL | Status: DC
  Filled 2016-12-06: qty 1

## 2016-12-06 MED ORDER — DOXYCYCLINE HYCLATE 100 MG PO CAPS: 100.0000 mg | ORAL_CAPSULE | Freq: Two times a day (BID) | ORAL | 0 refills | Status: AC

## 2016-12-06 MED ORDER — METRONIDAZOLE 500 MG PO TABS: 500.0000 mg | ORAL_TABLET | Freq: Two times a day (BID) | ORAL | 0 refills | Status: AC

## 2016-12-06 MED ORDER — OXYCODONE HCL 5 MG PO TABS: 5.0000 mg | ORAL_TABLET | ORAL | 0 refills | Status: AC | PRN

## 2016-12-06 MED ORDER — POLYETHYLENE GLYCOL 3350 17 G PO PACK
17.0000 g | PACK | Freq: Every day | ORAL | Status: DC
  Filled 2016-12-06: qty 1

## 2016-12-06 NOTE — Progress Notes (Signed)
[facility]ionna Polak Progress Note 2 Days Post-Op  Subjective: Patient looks great with minimal discomfort.  Objective: Vital signs in last 24 hours: Temp:  [97.7 F (36.5 C)-98.7 F (37.1 C)] 98.1 F (36.7 C) (04/18 0619) Pulse Rate:  [73-99] 73 (04/18 0619) Resp:  [17-20] 17 (04/18 0619) BP: (111-115)/(66-76) 115/75 (04/18 0619) SpO2:  [99 %-100 %] 99 % (04/18 0619) Last BM Date: 12/01/16  Intake/Output from previous day: 04/17 0701 - 04/18 0700 In: 4146.7 [P.O.:1800; I.V.:1946.7; IV Piggyback:400] Out: 600 [Urine:600] Intake/Output this shift: Total I/O In: 1281.3 [P.O.:340; I.V.:941.3] Out: -   General: No distress  Lungs: Clear  Abd: Great normal bowel sounds.  Wounds look good.  Extremities: No changes  Neuro: Intact  Lab Results:  (wbc:2,hgb:2,hct:2,plt:2) BMET ) Recent Labs  12/05/16 0423 12/06/16 0254  NA 136 137  K 3.5 3.7  CL 105 110  CO2 20* 22  GLUCOSE 111* 96  BUN <5* 7  CREATININE 0.60 0.65  CALCIUM 8.8* 8.5*   PT/INR No results for input(s): LABPROT, INR in the last 72 hours. ABG No results for input(s): PHART, HCO3 in the last 72 hours.  Invalid input(s): PCO2, PO2  Studies/Results: US Transvaginal Non-ob  Result Date: 12/04/2016 CLINICAL DATA:  Menorrhagia.  Ovarian cyst. EXAM: TRANSABDOMINAL AND TRANSVAGINAL ULTRASOUND OF PELVIS TECHNIQUE: Both transabdominal and transvaginal ultrasound examinations of the pelvis were performed. Transabdominal technique was performed for global imaging of the pelvis including uterus, ovaries, adnexal regions, and pelvic cul-de-sac. It was necessary to proceed with endovaginal exam following the transabdominal exam to visualize the endometrium and ovaries. COMPARISON:  CT scan 12/04/2016 FINDINGS: Uterus Measurements: 8.2 x 3.4 x 3.7 cm. No fibroids or other mass visualized. Endometrium Thickness: 10.1 mm. Small amount of fluid in the endometrial canal in the lower uterine segment. Right ovary  Measurements: 4.2 x 3.8 x 3.2 cm. 3.3 x 2.9 x 3.2 cm cyst Left ovary Measurements: 2.9 x 1.9 x 1.8 cm. Normal appearance/no adnexal mass. Other findings Small amount of free pelvic fluid IMPRESSION: 1. Normal uterus 2. 3.3 cm right ovarian cyst. 3. Small amount of free pelvic fluid. Electronically Signed   By: Rudie Meyer M.D.   On: 12/04/2016 11:20   US Pelvis Complete  Result Date: 12/04/2016 CLINICAL DATA:  Menorrhagia.  Ovarian cyst. EXAM: TRANSABDOMINAL AND TRANSVAGINAL ULTRASOUND OF PELVIS TECHNIQUE: Both transabdominal and transvaginal ultrasound examinations of the pelvis were performed. Transabdominal technique was performed for global imaging of the pelvis including uterus, ovaries, adnexal regions, and pelvic cul-de-sac. It was necessary to proceed with endovaginal exam following the transabdominal exam to visualize the endometrium and ovaries. COMPARISON:  CT scan 12/04/2016 FINDINGS: Uterus Measurements: 8.2 x 3.4 x 3.7 cm. No fibroids or other mass visualized. Endometrium Thickness: 10.1 mm. Small amount of fluid in the endometrial canal in the lower uterine segment. Right ovary Measurements: 4.2 x 3.8 x 3.2 cm. 3.3 x 2.9 x 3.2 cm cyst Left ovary Measurements: 2.9 x 1.9 x 1.8 cm. Normal appearance/no adnexal mass. Other findings Small amount of free pelvic fluid IMPRESSION: 1. Normal uterus 2. 3.3 cm right ovarian cyst. 3. Small amount of free pelvic fluid. Electronically Signed   By: Rudie Meyer M.D.   On: 12/04/2016 11:20   Ct Abdomen Pelvis W Contrast  Result Date: 12/04/2016 CLINICAL DATA:  Acute right lower quadrant abdominal pain. EXAM: CT ABDOMEN AND PELVIS WITH CONTRAST TECHNIQUE: Multidetector CT imaging of the abdomen and pelvis was performed using the standard protocol following bolus administration of intravenous  contrast. CONTRAST:  100 mL of Isovue-300 intravenously. COMPARISON:  None. FINDINGS: Lower chest: No acute abnormality. Hepatobiliary: Possible gallstone may be  present. Liver appears normal. Pancreas: Unremarkable. No pancreatic ductal dilatation or surrounding inflammatory changes. Spleen: Normal in size without focal abnormality. Adrenals/Urinary Tract: Adrenal glands are unremarkable. Kidneys are normal, without renal calculi, focal lesion, or hydronephrosis. Bladder is unremarkable. Stomach/Bowel: Stomach is within normal limits. Appendix appears normal. No evidence of bowel wall thickening, distention, or inflammatory changes. Vascular/Lymphatic: No significant vascular findings are present. No enlarged abdominal or pelvic lymph nodes. Reproductive: 3.6 cm right ovarian cyst is noted. Other: There is noted moderate pneumoperitoneum seen in the epigastric region. No definite hernia is noted. Musculoskeletal: No acute or significant osseous findings. IMPRESSION: 3.6 cm right ovarian cyst is noted. Pelvic ultrasound is recommended for further evaluation. Moderate pneumoperitoneum is noted in the epigastric region concerning for rupture of hollow viscus. Immediate surgical consultation is recommended. Critical Value/emergent results were called by telephone at the time of interpretation on 12/04/2016 at 8:32 am to Dr. Shaune Pollack , who verbally acknowledged these results. Electronically Signed   By: Lupita Raider, M.D.   On: 12/04/2016 08:35    Anti-infectives: Anti-infectives    Start     Dose/Rate Route Frequency Ordered Stop   12/04/16 1830  cefOXitin (MEFOXIN) 2 g in dextrose 5 % 50 mL IVPB     2 g 100 mL/hr over 30 Minutes Intravenous Every 6 hours 12/04/16 1745     12/04/16 1800  doxycycline (VIBRAMYCIN) 100 mg in dextrose 5 % 250 mL IVPB     100 mg 125 mL/hr over 120 Minutes Intravenous Every 12 hours 12/04/16 1745     12/04/16 1500  piperacillin-tazobactam (ZOSYN) IVPB 3.375 g  Status:  Discontinued     3.375 g 12.5 mL/hr over 240 Minutes Intravenous Every 8 hours 12/04/16 0837 12/04/16 1811   12/04/16 0845  piperacillin-tazobactam (ZOSYN) IVPB  3.375 g     3.375 g 100 mL/hr over 30 Minutes Intravenous  Once 12/04/16 0835 12/04/16 0928      Assessment/Plan: s/p Procedure(s): LAPAROSCOPY DIAGNOSTIC APPENDECTOMY LAPAROSCOPIC Patient's WBC has normalized.  Possible home today with oral antibiotics.  LOS: 1 day   Marta Lamas. Gae Bon, MD, FACS [phone] [facility] 12/06/2016

## 2016-12-06 NOTE — Discharge Summary (Signed)
[facility] Discharge Summary   Patient ID: Brianna West MRN: [id number] DOB/AGE: 09-Sep-1995 21 y.o.  Admit date: 12/04/2016 Discharge date: 12/06/2016  Admitting Diagnosis: Abdominal pain Pneumoperitoneum   Discharge Diagnosis Patient Active Problem List   Diagnosis Date Noted  . PID (acute pelvic inflammatory disease) 12/04/2016  . Vaginal discharge, bloody 05/18/2015  . Active labor at term 04/05/2015  . [redacted] weeks gestation of pregnancy   . Obesity affecting pregnancy in second trimester, antepartum   . [redacted] weeks gestation of pregnancy   . Encounter for fetal anatomic survey   . Significant discrepancy between uterine size and clinical dates, antepartum   . Obesity complicating pregnancy in second trimester     Consultants None  Imaging: US Transvaginal Non-ob  Result Date: 12/04/2016 CLINICAL DATA:  Menorrhagia.  Ovarian cyst. EXAM: TRANSABDOMINAL AND TRANSVAGINAL ULTRASOUND OF PELVIS TECHNIQUE: Both transabdominal and transvaginal ultrasound examinations of the pelvis were performed. Transabdominal technique was performed for global imaging of the pelvis including uterus, ovaries, adnexal regions, and pelvic cul-de-sac. It was necessary to proceed with endovaginal exam following the transabdominal exam to visualize the endometrium and ovaries. COMPARISON:  CT scan 12/04/2016 FINDINGS: Uterus Measurements: 8.2 x 3.4 x 3.7 cm. No fibroids or other mass visualized. Endometrium Thickness: 10.1 mm. Small amount of fluid in the endometrial canal in the lower uterine segment. Right ovary Measurements: 4.2 x 3.8 x 3.2 cm. 3.3 x 2.9 x 3.2 cm cyst Left ovary Measurements: 2.9 x 1.9 x 1.8 cm. Normal appearance/no adnexal mass. Other findings Small amount of free pelvic fluid IMPRESSION: 1. Normal uterus 2. 3.3 cm right ovarian cyst. 3. Small amount of free pelvic fluid. Electronically Signed   By: Rudie Meyer M.D.   On: 12/04/2016 11:20   US Pelvis Complete  Result  Date: 12/04/2016 CLINICAL DATA:  Menorrhagia.  Ovarian cyst. EXAM: TRANSABDOMINAL AND TRANSVAGINAL ULTRASOUND OF PELVIS TECHNIQUE: Both transabdominal and transvaginal ultrasound examinations of the pelvis were performed. Transabdominal technique was performed for global imaging of the pelvis including uterus, ovaries, adnexal regions, and pelvic cul-de-sac. It was necessary to proceed with endovaginal exam following the transabdominal exam to visualize the endometrium and ovaries. COMPARISON:  CT scan 12/04/2016 FINDINGS: Uterus Measurements: 8.2 x 3.4 x 3.7 cm. No fibroids or other mass visualized. Endometrium Thickness: 10.1 mm. Small amount of fluid in the endometrial canal in the lower uterine segment. Right ovary Measurements: 4.2 x 3.8 x 3.2 cm. 3.3 x 2.9 x 3.2 cm cyst Left ovary Measurements: 2.9 x 1.9 x 1.8 cm. Normal appearance/no adnexal mass. Other findings Small amount of free pelvic fluid IMPRESSION: 1. Normal uterus 2. 3.3 cm right ovarian cyst. 3. Small amount of free pelvic fluid. Electronically Signed   By: Rudie Meyer M.D.   On: 12/04/2016 11:20    Procedures Dr. Janee Morn (12/04/16) - Laparoscopy diagnostic, drainage of pelvic abscess, appendectomy laparoscopic   Hospital Course:  Brianna West is a 21 year old female who presented to [facility] with sudden onset pelvic pain that woke her from sleep that began in her lower bilateral abdomen then became generalized.  Workup showed 3.6cm right ovarian cyst and moderate epigastric pneumoperitoneum. Patient was admitted and underwent procedure listed above. Post operative diagnosis revealed severe pelvic inflammatory disease. She tolerated procedure well and was transferred to the floor. Gonorrhea and chlamydia cultures were negative. Pt's wet prep revealed clue cells. Pt was placed on antibiotics. Diet was advanced as tolerated.  On POD#2, the patient was voiding well, tolerating  diet, ambulating well, pain well controlled, vital signs  stable, incisions c/d/i and felt stable for discharge home.  Patient will follow up in our office in 2 weeks and knows to call with questions or concerns.  She will call to confirm appointment date/time. Safe sex practices and OB/GYN f/u were discussed prior to discharge.  Patient was discharged in good condition.  The [place] Substance controlled database was reviewed prior to prescribing narcotic pain medication to this patient.   Allergies as of 12/06/2016   No Known Allergies     Medication List    TAKE these medications   acetaminophen 325 MG tablet Commonly known as:  TYLENOL Take 2 tablets (650 mg total) by mouth every 4 (four) hours as needed (for pain scale < 4).   doxycycline 100 MG capsule Commonly known as:  VIBRAMYCIN Take 1 capsule (100 mg total) by mouth 2 (two) times daily.   ibuprofen 600 MG tablet Commonly known as:  ADVIL,MOTRIN Take 1 tablet (600 mg total) by mouth every 6 (six) hours. What changed:  when to take this  reasons to take this   IMPLANON [place] Inject 1 application into the skin once.   metroNIDAZOLE 500 MG tablet Commonly known as:  FLAGYL Take 1 tablet (500 mg total) by mouth 2 (two) times daily.   oxyCODONE 5 MG immediate release tablet Commonly known as:  Oxy IR/ROXICODONE Take 1 tablet (5 mg total) by mouth every 4 (four) hours as needed for moderate pain.        Follow-up Information    [facility], [place]. Call.   Specialty:  General Surgery Why:  CALL OUR OFFICE TO CONFIRM YOUR FOLLOW UP APPOINTMENT DATE AND TIME Contact information: [address] [place] [address] [phone]       [facility] Follow up.   Contact information: [address] [place] [address] [phone]       [facility] .   Contact information: [address] [place] [address] [phone]       [facility]. Call.   Why:  to schedule an appointment with and OB/GYN in 2-3 weeks, call this number first and if not successful call the numbers below to find a provider  Contact information: [address] [place] [address] [phone]          Signed: Joyce Copa [facility] 12/06/2016, 10:13 AM Pager: [phone] Consults: [phone] Mon-Fri 7:00 am-4:30 pm Sat-Sun 7:00 am-11:30 am

## 2016-12-06 NOTE — Discharge Instructions (Signed)
Please arrive at least 30 min before your appointment to complete your check in paperwork.  If you are unable to arrive 30 min prior to your appointment time we may have to cancel or reschedule you. ° °LAPAROSCOPIC SURGERY: POST OP INSTRUCTIONS  °1. DIET: Follow a light bland diet the first 24 hours after arrival home, such as soup, liquids, crackers, etc. Be sure to include lots of fluids daily. Avoid fast food or heavy meals as your are more likely to get nauseated. Eat a low fat the next few days after surgery.  °2. Take your usually prescribed home medications unless otherwise directed. °3. PAIN CONTROL:  °1. Pain is best controlled by a usual combination of three different methods TOGETHER:  °1. Ice/Heat °2. Over the counter pain medication °3. Prescription pain medication °2. Most patients will experience some swelling and bruising around the incisions. Ice packs or heating pads (30-60 minutes up to 6 times a day) will help. Use ice for the first few days to help decrease swelling and bruising, then switch to heat to help relax tight/sore spots and speed recovery. Some people prefer to use ice alone, heat alone, alternating between ice & heat. Experiment to what works for you. Swelling and bruising can take several weeks to resolve.  °3. It is helpful to take an over-the-counter pain medication regularly for the first few weeks. Choose one of the following that works best for you:  °1. Naproxen (Aleve, etc) Two 220mg tabs twice a day °2. Ibuprofen (Advil, etc) Three 200mg tabs four times a day (every meal & bedtime) °3. Acetaminophen (Tylenol, etc) 500-650mg four times a day (every meal & bedtime) °4. A prescription for pain medication (such as oxycodone, hydrocodone, etc) should be given to you upon discharge. Take your pain medication as prescribed.  °1. If you are having problems/concerns with the prescription medicine (does not control pain, nausea, vomiting, rash, itching, etc), please call us [phone] to see if we need to switch you to a different pain medicine that will work better for you and/or control your side effect better. °2. If you need a refill on your pain medication, please contact your pharmacy. They will contact our office to request authorization. Prescriptions will not be filled after 5 pm or on week-ends. °4. Avoid getting constipated. Between the surgery and the pain medications, it is common to experience some constipation. Increasing fluid intake and taking a fiber supplement (such as Metamucil, Citrucel, FiberCon, MiraLax, etc) 1-2 times a day regularly will usually help prevent this problem from occurring. A mild laxative (prune juice, Milk of Magnesia, MiraLax, etc) should be taken according to package directions if there are no bowel movements after 48 hours.  °5. Watch out for diarrhea. If you have many loose bowel movements, simplify your diet to bland foods & liquids for a few days. Stop any stool softeners and decrease your fiber supplement. Switching to mild anti-diarrheal medications (Kayopectate, Pepto Bismol) can help. If this worsens or does not improve, please call us. °6. Wash / shower every day. You may shower over the dressings as they are waterproof. Continue to shower over incision(s) after the dressing is off. °7. Remove your waterproof bandages 5 days after surgery. You may leave the incision open to air. You may replace a dressing/Band-Aid to cover the incision for comfort if you wish.  °8. ACTIVITIES as tolerated:  °1. You may resume regular (light) daily activities beginning the next day--such as daily self-care, walking, climbing stairs--gradually   increasing activities as tolerated. If you can walk 30 minutes without difficulty, it is safe to try more intense activity such as jogging, treadmill, bicycling, low-impact aerobics, swimming, etc. 2. Save the most intensive and strenuous activity for last such as sit-ups, heavy lifting, contact sports, etc Refrain  from any heavy lifting or straining until you are off narcotics for pain control.  3. DO NOT PUSH THROUGH PAIN. Let pain be your guide: If it hurts to do something, don't do it. Pain is your body warning you to avoid that activity for another week until the pain goes down. 4. You may drive when you are no longer taking prescription pain medication, you can comfortably wear a seatbelt, and you can safely maneuver your car and apply brakes. 5. You may have sexual intercourse when it is comfortable.  9. FOLLOW UP in our office  1. Please call [facility] at [phone] to set up an appointment to see your surgeon in the office for a follow-up appointment approximately 2-3 weeks after your surgery. 2. Make sure that you call for this appointment the day you arrive home to insure a convenient appointment time.      10. IF YOU HAVE DISABILITY OR FAMILY LEAVE FORMS, BRING THEM TO THE               OFFICE FOR PROCESSING.   WHEN TO CALL us [phone]:  1. Poor pain control 2. Reactions / problems with new medications (rash/itching, nausea, etc)  3. Fever over 101.5 F (38.5 C) 4. Inability to urinate 5. Nausea and/or vomiting 6. Worsening swelling or bruising 7. Continued bleeding from incision. 8. Increased pain, redness, or drainage from the incision  The clinic staff is available to answer your questions during regular business hours (8:30am-5pm). Please dont hesitate to call and ask to speak to one of our nurses for clinical concerns.  If you have a medical emergency, go to the nearest emergency room or call 911.  A surgeon from [facility] is always on call at the [facility], [place]  [address], [place], [place] [address] ?  MAIN: [phone] ? TOLL FREE: [phone] ?  FAX [phone]  [email or web address]  FOLLOW UP WITH YOUR OB/GYN IN 1-2 WEEKS, CALL THEM TO SCHEDULE AN APPOINTMENT

## 2016-12-06 NOTE — Progress Notes (Signed)
Pt is ambulating to the hall. Surgical pain is controlled. Lap sites clean and dry. Discharge instructions given to pt, verbalized understanding. Discharged to home accompanied by her mother.

## 2016-12-09 LAB — CULTURE, BLOOD (ROUTINE X 2)
CULTURE: NO GROWTH
Culture: NO GROWTH
SPECIAL REQUESTS: ADEQUATE
SPECIAL REQUESTS: ADEQUATE

## 2016-12-09 LAB — AEROBIC/ANAEROBIC CULTURE (SURGICAL/DEEP WOUND)

## 2016-12-09 LAB — AEROBIC/ANAEROBIC CULTURE W GRAM STAIN (SURGICAL/DEEP WOUND): Culture: NO GROWTH

## 2017-01-22 NOTE — Addendum Note (Signed)
Addendum  created 01/22/17 1324 by Narelle Schoening, MD   Sign clinical note    

## 2017-02-13 ENCOUNTER — Ambulatory Visit: Payer: Self-pay | Admitting: Obstetrics and Gynecology

## 2017-02-20 ENCOUNTER — Emergency Department (HOSPITAL_COMMUNITY)
Admission: EM | Admit: 2017-02-20 | Discharge: 2017-02-20 | Disposition: A | Discharge status: Home / Self Care | Payer: Self-pay | Attending: Emergency Medicine | Admitting: Emergency Medicine

## 2017-02-20 ENCOUNTER — Encounter (HOSPITAL_COMMUNITY): Payer: Self-pay

## 2017-02-20 DIAGNOSIS — J029 Acute pharyngitis, unspecified: Secondary | ICD-10-CM

## 2017-02-20 DIAGNOSIS — Z791 Long term (current) use of non-steroidal anti-inflammatories (NSAID): Secondary | ICD-10-CM | POA: Insufficient documentation

## 2017-02-20 DIAGNOSIS — Z79899 Other long term (current) drug therapy: Secondary | ICD-10-CM | POA: Insufficient documentation

## 2017-02-20 DIAGNOSIS — J018 Other acute sinusitis: Secondary | ICD-10-CM | POA: Insufficient documentation

## 2017-02-20 LAB — RAPID STREP SCREEN (MED CTR MEBANE ONLY): STREPTOCOCCUS, GROUP A SCREEN (DIRECT): NEGATIVE

## 2017-02-20 MED ORDER — AMOXICILLIN 500 MG PO CAPS
500.0000 mg | ORAL_CAPSULE | Freq: Once | ORAL | Status: AC
  Administered 2017-02-20: 500 mg via ORAL
  Filled 2017-02-20: qty 1

## 2017-02-20 MED ORDER — DEXAMETHASONE 4 MG PO TABS: ORAL_TABLET | ORAL | 0 refills | Status: AC

## 2017-02-20 MED ORDER — AMOXICILLIN 500 MG PO CAPS: 500.0000 mg | ORAL_CAPSULE | Freq: Three times a day (TID) | ORAL | 0 refills | Status: AC

## 2017-02-20 MED ORDER — LORATADINE-PSEUDOEPHEDRINE ER 5-120 MG PO TB12: 1.0000 | ORAL_TABLET | Freq: Two times a day (BID) | ORAL | 0 refills | Status: AC

## 2017-02-20 MED ORDER — IBUPROFEN 800 MG PO TABS
800.0000 mg | ORAL_TABLET | Freq: Once | ORAL | Status: AC
  Administered 2017-02-20: 800 mg via ORAL
  Filled 2017-02-20: qty 1

## 2017-02-20 MED ORDER — ONDANSETRON HCL 4 MG PO TABS
4.0000 mg | ORAL_TABLET | Freq: Once | ORAL | Status: AC
  Administered 2017-02-20: 4 mg via ORAL
  Filled 2017-02-20: qty 1

## 2017-02-20 NOTE — ED Provider Notes (Signed)
[facility]-EMERGENCY DEPT Provider Note   CSN: [id number] Arrival date & time: 02/20/17  1929  By signing my name below, I, Diona BrownerJennifer Gorman, attest that this documentation has been prepared under the direction and in the presence of [employer] Services[employer]obson Hailee Hollick PA-C. Electronically Signed: Diona BrownerJennifer Gorman, ED Scribe. 02/20/17. 10:00 PM.  History   Chief Complaint Chief Complaint  Patient presents with  . Sore Throat    HPI Brianna West is a 21 y.o. female who presents to the Emergency Department complaining of a gradually worsening sore throat that started 3 days ago. Associated sx include nasal congestion, rhinorrhea, productive cough (green sputum), post nasal drip, HA, trouble swallowing, and chewing. She has tried various at home remedies and OTC medications with no relief. Pt denies fever and chills.   The history is provided by the patient. No language interpreter was used.  Sore Throat  The current episode started more than 2 days ago. The problem occurs constantly. The problem has been gradually worsening. Associated symptoms include headaches. The symptoms are aggravated by swallowing. Nothing relieves the symptoms. She has tried rest and acetaminophen for the symptoms. The treatment provided no relief.    Past Medical History:  Diagnosis Date  . Obesity   . Pelvic inflammatory disease (PID) 11/2016    Patient Active Problem List   Diagnosis Date Noted  . PID (acute pelvic inflammatory disease) 12/04/2016  . Vaginal discharge, bloody 05/18/2015  . Active labor at term 04/05/2015  . [redacted] weeks gestation of pregnancy   . Obesity affecting pregnancy in second trimester, antepartum   . [redacted] weeks gestation of pregnancy   . Encounter for fetal anatomic survey   . Significant discrepancy between uterine size and clinical dates, antepartum   . Obesity complicating pregnancy in second trimester     Past Surgical History:  Procedure Laterality Date  . APPENDECTOMY  11/2016  . LAPAROSCOPIC  APPENDECTOMY  12/04/2016   Procedure: APPENDECTOMY LAPAROSCOPIC;  Surgeon: Violeta GelinasBurke Thompson, MD;  [facility];  Service: General;;  . LAPAROSCOPY N/A 12/04/2016   Procedure: LAPAROSCOPY DIAGNOSTIC;  Surgeon: Violeta GelinasBurke Thompson, MD;  [facility];  Service: General;  Laterality: N/A;    OB History    Gravida Para Term Preterm AB Living   1 1 1     1    SAB TAB Ectopic Multiple Live Births         0 1       Home Medications    Prior to Admission medications   Medication Sig Start Date End Date Taking? Authorizing Provider  acetaminophen (TYLENOL) 325 MG tablet Take 2 tablets (650 mg total) by mouth every 4 (four) hours as needed (for pain scale < 4). 04/08/15   Wouk, Wilfred CurtisNoah Bedford, MD  Etonogestrel [facility](IMPLANON [place]) Inject 1 application into the skin once.    [provider]  ibuprofen (ADVIL,MOTRIN) 600 MG tablet Take 1 tablet (600 mg total) by mouth every 6 (six) hours. Patient taking differently: Take 600 mg by mouth every 6 (six) hours as needed for moderate pain.  04/08/15   Wouk, Wilfred CurtisNoah Bedford, MD  oxyCODONE (OXY IR/ROXICODONE) 5 MG immediate release tablet Take 1 tablet (5 mg total) by mouth every 4 (four) hours as needed for moderate pain. 12/06/16   Jerre SimonFocht, Jessica L, PA    Family History Family History  Problem Relation Age of Onset  . Asthma Neg Hx   . Diabetes Neg Hx   . Heart disease Neg Hx   . Stroke Neg Hx   .  Hypertension Neg Hx   . Hearing loss Neg Hx     Social History Social History  Substance Use Topics  . Smoking status: Never Smoker  . Smokeless tobacco: Never Used  . Alcohol use No     Allergies   Patient has no known allergies.   Review of Systems Review of Systems  Constitutional: Negative for chills and fever.  HENT: Positive for congestion, postnasal drip, rhinorrhea, sore throat and trouble swallowing.   Respiratory: Positive for cough.   Neurological: Positive for headaches.  All other systems reviewed and are negative.    Physical  Exam Updated Vital Signs BP 127/69   Pulse 90   Temp 98.8 F (37.1 C) (Oral)   Resp 18   LMP 12/21/2016   SpO2 100%   Physical Exam  Constitutional: She is oriented to person, place, and time. She appears well-developed and well-nourished.  HENT:  Head: Normocephalic.  Mouth/Throat: Uvula swelling present.  Nasal congestion present. Mucus on posterior oropharynx. Increased redness of the tonsilla area.   Eyes: EOM are normal.  Neck: Normal range of motion.  Pulmonary/Chest: Effort normal.  Lungs are clear. Symmetrical rise and fall of the chest.  Abdominal: She exhibits no distension.  Musculoskeletal: Normal range of motion.  Lymphadenopathy:    She has no cervical adenopathy.  Neurological: She is alert and oriented to person, place, and time.  Skin: No rash noted.  No rash on the palms.  Psychiatric: She has a normal mood and affect.  Nursing note and vitals reviewed.    ED Treatments / Results  DIAGNOSTIC STUDIES: Oxygen Saturation is 100% on RA, normal by my interpretation.   COORDINATION OF CARE: 10:00 PM-Discussed next steps with pt which includes taking antibiotics, steroid medication and tylenol and/or ibuprofen. She is also to do salt water gargles. Pt verbalized understanding and is agreeable with the plan.   Labs (all labs ordered are listed, but only abnormal results are displayed) Labs Reviewed  RAPID STREP SCREEN (NOT AT [facility])    EKG  EKG Interpretation None       Radiology No results found.  Procedures Procedures (including critical care time)  Medications Ordered in ED Medications - No data to display   Initial Impression / Assessment and Plan / ED Course  I have reviewed the triage vital signs and the nursing notes.  Pertinent labs & imaging results that were available during my care of the patient were reviewed by me and considered in my medical decision making (see chart for details).      MDM  10:01 PM Pt has hx of 3 days  of sore throat, congestion and HA. No reported high fever. Examination suggests sinusitis and pharyngitis. Pt will be treated with salt water gargles, tylenol and or ibuprofen, decadron, Claritin D, and Amoxicillin. We discussed good hand washing and hydration.   Final Clinical Impressions(s) / ED Diagnoses   Final diagnoses:  Other acute sinusitis, recurrence not specified  Acute pharyngitis, unspecified etiology    New Prescriptions New Prescriptions   No medications on file   **I personally performed the services described in this documentation, which was scribed in my presence. The recorded information has been reviewed and is accurate.Ivery Quale, PA-C 02/21/17 1544    Charlynne Pander, MD 02/22/17 [phone]

## 2017-02-20 NOTE — ED Triage Notes (Signed)
Pt reports sore throat, nasal congestion, runny nose and productive cough of green phlegm. Her voice is hoarse.

## 2017-02-20 NOTE — Discharge Instructions (Signed)
Please increase fluids. Please wash hands frequently. Salt water gargles maybe helpful. Use Claritin-D Decadron 2 times daily. Use Tylenol, and or ibuprofen for soreness, and/or temperature elevation. Use Amoxil 3 times daily with a meal.

## 2017-02-23 LAB — CULTURE, GROUP A STREP (THRC)

## 2018-08-26 ENCOUNTER — Encounter (HOSPITAL_COMMUNITY): Payer: Self-pay | Admitting: Emergency Medicine

## 2018-08-26 ENCOUNTER — Emergency Department (HOSPITAL_COMMUNITY)
Admission: EM | Admit: 2018-08-26 | Discharge: 2018-08-27 | Disposition: A | Discharge status: Home / Self Care | Payer: Self-pay | Attending: Emergency Medicine | Admitting: Emergency Medicine

## 2018-08-26 ENCOUNTER — Other Ambulatory Visit: Payer: Self-pay

## 2018-08-26 ENCOUNTER — Emergency Department (HOSPITAL_COMMUNITY): Payer: Self-pay

## 2018-08-26 DIAGNOSIS — J111 Influenza due to unidentified influenza virus with other respiratory manifestations: Secondary | ICD-10-CM

## 2018-08-26 DIAGNOSIS — Z79899 Other long term (current) drug therapy: Secondary | ICD-10-CM | POA: Insufficient documentation

## 2018-08-26 DIAGNOSIS — J101 Influenza due to other identified influenza virus with other respiratory manifestations: Secondary | ICD-10-CM | POA: Insufficient documentation

## 2018-08-26 DIAGNOSIS — R69 Illness, unspecified: Secondary | ICD-10-CM

## 2018-08-26 LAB — COMPREHENSIVE METABOLIC PANEL
ALT: 24 U/L (ref 0–44)
AST: 29 U/L (ref 15–41)
Albumin: 4.2 g/dL (ref 3.5–5.0)
Alkaline Phosphatase: 70 U/L (ref 38–126)
Anion gap: 11 (ref 5–15)
BUN: 7 mg/dL (ref 6–20)
CO2: 22 mmol/L (ref 22–32)
Calcium: 8.9 mg/dL (ref 8.9–10.3)
Chloride: 103 mmol/L (ref 98–111)
Creatinine, Ser: 0.85 mg/dL (ref 0.44–1.00)
GFR calc non Af Amer: >60 mL/min (ref >60)
Glucose, Bld: 109 mg/dL — ABNORMAL HIGH (ref 70–99)
Potassium: 3.7 mmol/L (ref 3.5–5.1)
SODIUM: 136 mmol/L (ref 135–145)
Total Bilirubin: 0.8 mg/dL (ref 0.3–1.2)
Total Protein: 8.3 g/dL — ABNORMAL HIGH (ref 6.5–8.1)

## 2018-08-26 LAB — CBC WITH DIFFERENTIAL/PLATELET
Abs Immature Granulocytes: 0.02 10*3/uL (ref 0.00–0.07)
Basophils Absolute: 0 10*3/uL (ref 0.0–0.1)
Basophils Relative: 0 %
Eosinophils Absolute: 0 10*3/uL (ref 0.0–0.5)
Eosinophils Relative: 0 %
HCT: 32.9 % — ABNORMAL LOW (ref 36.0–46.0)
Hemoglobin: 9.1 g/dL — ABNORMAL LOW (ref 12.0–15.0)
Immature Granulocytes: 0 %
Lymphocytes Relative: 19 %
Lymphs Abs: 1.2 10*3/uL (ref 0.7–4.0)
MCH: 17.3 pg — AB (ref 26.0–34.0)
MCHC: 27.7 g/dL — ABNORMAL LOW (ref 30.0–36.0)
MCV: 62.4 fL — ABNORMAL LOW (ref 80.0–100.0)
Monocytes Absolute: 0.7 10*3/uL (ref 0.1–1.0)
Monocytes Relative: 11 %
Neutro Abs: 4.4 10*3/uL (ref 1.7–7.7)
Neutrophils Relative %: 70 %
PLATELETS: 308 10*3/uL (ref 150–400)
RBC: 5.27 MIL/uL — AB (ref 3.87–5.11)
RDW: 21.2 % — ABNORMAL HIGH (ref 11.5–15.5)
WBC: 6.3 10*3/uL (ref 4.0–10.5)
nRBC: 0 % (ref 0.0–0.2)

## 2018-08-26 LAB — URINALYSIS, ROUTINE W REFLEX MICROSCOPIC
Bilirubin Urine: NEGATIVE
Glucose, UA: NEGATIVE mg/dL
Hgb urine dipstick: NEGATIVE
Ketones, ur: 20 mg/dL — AB
Leukocytes, UA: NEGATIVE
Nitrite: NEGATIVE
Protein, ur: 100 mg/dL — AB
Specific Gravity, Urine: 1.027 (ref 1.005–1.030)
pH: 5 (ref 5.0–8.0)

## 2018-08-26 LAB — I-STAT BETA HCG BLOOD, ED (MC, WL, AP ONLY): I-stat hCG, quantitative: <5 m[IU]/mL (ref <5)

## 2018-08-26 LAB — PROTIME-INR
INR: 1.15
Prothrombin Time: 14.6 seconds (ref 11.4–15.2)

## 2018-08-26 LAB — I-STAT CG4 LACTIC ACID, ED: Lactic Acid, Venous: 1.29 mmol/L (ref 0.5–1.9)

## 2018-08-26 LAB — GROUP A STREP BY PCR: Group A Strep by PCR: NOT DETECTED

## 2018-08-26 MED ORDER — ACETAMINOPHEN 325 MG PO TABS
325.0000 mg | ORAL_TABLET | Freq: Once | ORAL | Status: AC
  Administered 2018-08-26: 325 mg via ORAL

## 2018-08-26 MED ORDER — ACETAMINOPHEN 325 MG PO TABS
325.0000 mg | ORAL_TABLET | Freq: Once | ORAL | Status: AC
  Administered 2018-08-26: 325 mg via ORAL
  Filled 2018-08-26: qty 1

## 2018-08-26 MED ORDER — SODIUM CHLORIDE 0.9 % IV BOLUS
1000.0000 mL | Freq: Once | INTRAVENOUS | Status: AC
  Administered 2018-08-26: 1000 mL via INTRAVENOUS

## 2018-08-26 NOTE — ED Triage Notes (Signed)
Pt. Stated, I started having fever, headache and sore throat since yesterday. Ive tried Dayquil and Niquil and nothing helps.

## 2018-08-26 NOTE — ED Notes (Signed)
Patient transported to X-ray 

## 2018-08-26 NOTE — ED Notes (Signed)
Pt ambulatory to the bed, no signs of distress noted at this time.

## 2018-08-26 NOTE — ED Provider Notes (Signed)
[facility] Provider Note   CSN: [id number] Arrival date & time: 08/26/18  1705     History   Chief Complaint Chief Complaint  Patient presents with  . Fever  . Headache  . Sore Throat    HPI Brianna West is a 23 y.o. female with a history of obesity who presents to the emergency department with a chief complaint of flu-like symptoms.  She reports that she developed fever, chills, body aches, headache, sore throat, and rhinorrhea yesterday.  She has been taking an unknown over-the-counter medication, DayQuil, and NyQuil without improvement.  She did not receive a flu shot this year.  She also reports one episode of posttussive emesis yesterday, but none today.  She denies neck pain or stiffness, shortness of breath, chest pain, diarrhea, abdominal pain, urinary symptoms, rash, dysphagia, or trismus.  Reports she has been eating and drinking without difficulty.  She is unsure of any specific sick contacts.  The history is provided by the patient. No language interpreter was used.    Past Medical History:  Diagnosis Date  . Obesity   . Pelvic inflammatory disease (PID) 11/2016    Patient Active Problem List   Diagnosis Date Noted  . PID (acute pelvic inflammatory disease) 12/04/2016  . Vaginal discharge, bloody 05/18/2015  . Active labor at term 04/05/2015  . [redacted] weeks gestation of pregnancy   . Obesity affecting pregnancy in second trimester, antepartum   . [redacted] weeks gestation of pregnancy   . Encounter for fetal anatomic survey   . Significant discrepancy between uterine size and clinical dates, antepartum   . Obesity complicating pregnancy in second trimester     Past Surgical History:  Procedure Laterality Date  . APPENDECTOMY  11/2016  . LAPAROSCOPIC APPENDECTOMY  12/04/2016   Procedure: APPENDECTOMY LAPAROSCOPIC;  Surgeon: Violeta GelinasBurke Thompson, MD;  [facility];  Service: General;;  . LAPAROSCOPY N/A 12/04/2016   Procedure: LAPAROSCOPY DIAGNOSTIC;  Surgeon: Violeta GelinasBurke Thompson, MD;  Location: [facility] OR;  Service: General;  Laterality: N/A;     OB History    Gravida  1   Para  1   Term  1   Preterm      AB      Living  1     SAB      TAB      Ectopic      Multiple  0   Live Births  1            Home Medications    Prior to Admission medications   Medication Sig Start Date End Date Taking? Authorizing Provider  acetaminophen (TYLENOL) 325 MG tablet Take 2 tablets (650 mg total) by mouth every 4 (four) hours as needed (for pain scale < 4). 04/08/15   Wouk, Wilfred CurtisNoah Bedford, MD  amoxicillin (AMOXIL) 500 MG capsule Take 1 capsule (500 mg total) by mouth 3 (three) times daily. 02/20/17   Ivery QualeBryant, Hobson, PA-C  dexamethasone (DECADRON) 4 MG tablet 1 po bid with food 02/20/17   Ivery QualeBryant, Hobson, PA-C  Etonogestrel [facility](IMPLANON [place]) Inject 1 application into the skin once.    [provider]  ibuprofen (ADVIL,MOTRIN) 600 MG tablet Take 1 tablet (600 mg total) by mouth every 6 (six) hours. Patient taking differently: Take 600 mg by mouth every 6 (six) hours as needed for moderate pain.  04/08/15   Wouk, Wilfred CurtisNoah Bedford, MD  loratadine-pseudoephedrine (CLARITIN-D 12 HOUR) 5-120 MG tablet Take 1 tablet by mouth 2 (two)  times daily. 02/20/17   Ivery Quale, PA-C  ondansetron (ZOFRAN ODT) 4 MG disintegrating tablet Take 1 tablet (4 mg total) by mouth every 8 (eight) hours as needed for nausea or vomiting. 08/27/18   Jaiyanna Safran A, PA-C  oxyCODONE (OXY IR/ROXICODONE) 5 MG immediate release tablet Take 1 tablet (5 mg total) by mouth every 4 (four) hours as needed for moderate pain. 12/06/16   Focht, Joyce Copa, PA  promethazine-dextromethorphan (PROMETHAZINE-DM) 6.25-15 MG/5ML syrup Take 5 mLs by mouth 4 (four) times daily as needed for cough. 08/27/18   Tryone Kille A, PA-C    Family History Family History  Problem Relation Age of Onset  . Asthma Neg Hx   . Diabetes Neg Hx   . Heart disease Neg Hx   . Stroke  Neg Hx   . Hypertension Neg Hx   . Hearing loss Neg Hx     Social History Social History   Tobacco Use  . Smoking status: Never Smoker  . Smokeless tobacco: Never Used  Substance Use Topics  . Alcohol use: No  . Drug use: No     Allergies   Patient has no known allergies.   Review of Systems Review of Systems  Constitutional: Positive for chills and fever. Negative for activity change.  HENT: Positive for congestion, rhinorrhea and sore throat. Negative for dental problem, drooling, ear pain, nosebleeds, sinus pressure, sinus pain and trouble swallowing.   Eyes: Negative for visual disturbance.  Respiratory: Positive for cough. Negative for shortness of breath and wheezing.   Cardiovascular: Negative for chest pain, palpitations and leg swelling.  Gastrointestinal: Positive for vomiting (post-tussive emesis). Negative for abdominal pain, diarrhea and nausea.  Genitourinary: Negative for dysuria.  Musculoskeletal: Negative for back pain, neck pain and neck stiffness.  Skin: Negative for rash.  Allergic/Immunologic: Negative for immunocompromised state.  Neurological: Positive for headaches. Negative for dizziness, seizures and weakness.  Psychiatric/Behavioral: Negative for confusion.     Physical Exam Updated Vital Signs BP (!) 120/55 (BP Location: Left Arm)   Pulse (!) 102   Temp 98.5 F (36.9 C) (Oral)   Resp 18   Ht 5\' 4"  (1.626 m)   LMP 08/18/2018   SpO2 99%   BMI 42.60 kg/m   Physical Exam Vitals signs and nursing note reviewed.  Constitutional:      General: She is not in acute distress. HENT:     Head: Normocephalic.     Right Ear: Hearing and tympanic membrane normal.     Left Ear: Hearing and tympanic membrane normal.     Ears:     Comments: Bilateral canals are erythematous.     Nose: Congestion and rhinorrhea present.     Right Sinus: No maxillary sinus tenderness or frontal sinus tenderness.     Left Sinus: No maxillary sinus tenderness or  frontal sinus tenderness.     Mouth/Throat:     Mouth: Mucous membranes are moist.     Pharynx: Posterior oropharyngeal erythema present. No pharyngeal swelling, oropharyngeal exudate or uvula swelling.     Tonsils: No tonsillar exudate or tonsillar abscesses.  Eyes:     Extraocular Movements: Extraocular movements intact.     Conjunctiva/sclera: Conjunctivae normal.     Pupils: Pupils are equal, round, and reactive to light.  Neck:     Musculoskeletal: Neck supple.  Cardiovascular:     Rate and Rhythm: Regular rhythm. Tachycardia present.     Heart sounds: No murmur. No friction rub. No gallop.   Pulmonary:  Effort: Pulmonary effort is normal. No respiratory distress.  Abdominal:     General: Bowel sounds are normal. There is no distension.     Palpations: Abdomen is soft. There is no mass.     Tenderness: There is no abdominal tenderness. There is no guarding.  Musculoskeletal: Normal range of motion.        General: No swelling or tenderness.  Skin:    General: Skin is warm.     Findings: No rash.  Neurological:     Mental Status: She is alert.  Psychiatric:        Behavior: Behavior normal.      ED Treatments / Results  Labs (all labs ordered are listed, but only abnormal results are displayed) Labs Reviewed  COMPREHENSIVE METABOLIC PANEL - Abnormal; Notable for the following components:      Result Value   Glucose, Bld 109 (*)    Total Protein 8.3 (*)    All other components within normal limits  CBC WITH DIFFERENTIAL/PLATELET - Abnormal; Notable for the following components:   RBC 5.27 (*)    Hemoglobin 9.1 (*)    HCT 32.9 (*)    MCV 62.4 (*)    MCH 17.3 (*)    MCHC 27.7 (*)    RDW 21.2 (*)    All other components within normal limits  URINALYSIS, ROUTINE W REFLEX MICROSCOPIC - Abnormal; Notable for the following components:   Color, Urine AMBER (*)    Ketones, ur 20 (*)    Protein, ur 100 (*)    Bacteria, UA RARE (*)    All other components within  normal limits  GROUP A STREP BY PCR  CULTURE, BLOOD (ROUTINE X 2)  CULTURE, BLOOD (ROUTINE X 2)  PROTIME-INR  I-STAT CG4 LACTIC ACID, ED  I-STAT BETA HCG BLOOD, ED ([facility], [facility], [facility] ONLY)  I-STAT CG4 LACTIC ACID, ED    EKG None  Radiology Dg Chest 2 View  Result Date: 08/26/2018 CLINICAL DATA:  Fever and sore throat. EXAM: CHEST - 2 VIEW COMPARISON:  None. FINDINGS: The heart size and mediastinal contours are within normal limits. Both lungs are clear. The visualized skeletal structures are unremarkable. IMPRESSION: No active cardiopulmonary disease. Electronically Signed   By: Elsie Stain M.D.   On: 08/26/2018 18:09    Procedures Procedures (including critical care time)  Medications Ordered in ED Medications  acetaminophen (TYLENOL) tablet 325 mg (325 mg Oral Given 08/26/18 1752)  acetaminophen (TYLENOL) tablet 325 mg (325 mg Oral Given 08/26/18 1752)  sodium chloride 0.9 % bolus 1,000 mL (1,000 mLs Intravenous New Bag/Given 08/26/18 2233)     Initial Impression / Assessment and Plan / ED Course  I have reviewed the triage vital signs and the nursing notes.  Pertinent labs & imaging results that were available during my care of the patient were reviewed by me and considered in my medical decision making (see chart for details).     23 year old female with a history of obesity presenting with flulike symptoms for 24 hours.  She has not received a flu shot this year.  Labs and chest x-ray were ordered by triage staff prior to my evaluation of the patient.  UA with mild ketonuria and proteinuria.  Urine does not appear infectious.  Hemoglobin is 9.1, this appears to be around her baseline.  Strep test is negative.  She was treated with Tylenol and IV fluids.  Tachycardia and fever have resolved.  She is normotensive and not hypoxic.  Chest  x-ray is clear.  Discussed the risk versus benefits of Tamiflu.  She declines prescription at this time.  We will discharge the patient home with  symptomatic treatment and recommended follow-up with a PCP if her symptoms do not start to improve in the next week.  She was also given strict return precautions to the emergency department.  She is hemodynamically stable and in no acute distress.  She is safe for discharge home with outpatient follow-up at this time.  Final Clinical Impressions(s) / ED Diagnoses   Final diagnoses:  Influenza-like illness    ED Discharge Orders         Ordered    ondansetron (ZOFRAN ODT) 4 MG disintegrating tablet  Every 8 hours PRN     08/27/18 0018    promethazine-dextromethorphan (PROMETHAZINE-DM) 6.25-15 MG/5ML syrup  4 times daily PRN     08/27/18 0018           Cecilio Ohlrich A, PA-C 08/27/18 0031    Dione Booze, MD 08/27/18 [phone]

## 2018-08-27 MED ORDER — PROMETHAZINE-DM 6.25-15 MG/5ML PO SYRP: 5.0000 mL | ORAL_SOLUTION | Freq: Four times a day (QID) | ORAL | 0 refills | Status: AC | PRN

## 2018-08-27 MED ORDER — ONDANSETRON 4 MG PO TBDP: 4.0000 mg | ORAL_TABLET | Freq: Three times a day (TID) | ORAL | 0 refills | Status: AC | PRN

## 2018-08-27 NOTE — ED Notes (Signed)
Patient Alert and oriented to baseline. Stable and ambulatory to baseline. Patient verbalized understanding of the discharge instructions.  Patient belongings were taken by the patient.   

## 2018-08-27 NOTE — Discharge Instructions (Addendum)
Thank you for allowing me to care for you today in the Emergency Department.   Your symptoms are consistent with an influenza-like illness.  Most viral illness is resolved in about 7-10 days.  Most often the cough is the last symptom to resolve, which may linger for up to 4-6 weeks.  Good control of your fever will make you feel much better. To treat your body aches, fever, or headache, take 650 mg of Tylenol or 600 mg of ibuprofen with food once every 6 hours.  You can also alternate between these 2 medications every 3 hours.  For cough, take 5 mL's of Promethazine DM.  For nausea or vomiting, you can let 1 tablet of Zofran dissolve in your tongue every 8 hours.  It is important to make sure that you wash your hands with warm soap and water frequently to avoid spread of infection.  Make sure you cover your mouth when you cough.  Use caution around small children or older adults because the flu is very contagious and these individuals can have a much worse course of the flu.  Return to the emergency department if you develop a high fever of greater than 100.5 F despite taking Tylenol and ibuprofen as described above, if you stop peeing, if you have vomiting despite taking Zofran, if you develop significant shortness of breath with a thick cough with mucus, or other new, concerning symptoms.

## 2018-08-31 LAB — CULTURE, BLOOD (ROUTINE X 2): Culture: NO GROWTH
